# Patient Record
Sex: Male | Born: 1970 | Race: White | Hispanic: No | Marital: Single | Smoking: Former smoker
Health system: Southern US, Community
[De-identification: ages and names within clinical notes are randomized; demographics above are authoritative.]

## PROBLEM LIST (undated history)

## (undated) DIAGNOSIS — R6521 Severe sepsis with septic shock: Secondary | ICD-10-CM

## (undated) DIAGNOSIS — E0590 Thyrotoxicosis, unspecified without thyrotoxic crisis or storm: Secondary | ICD-10-CM

## (undated) DIAGNOSIS — G4733 Obstructive sleep apnea (adult) (pediatric): Secondary | ICD-10-CM

## (undated) DIAGNOSIS — I10 Essential (primary) hypertension: Secondary | ICD-10-CM

## (undated) DIAGNOSIS — A419 Sepsis, unspecified organism: Secondary | ICD-10-CM

## (undated) DIAGNOSIS — C250 Malignant neoplasm of head of pancreas: Secondary | ICD-10-CM

## (undated) HISTORY — PX: WHIPPLE PROCEDURE: SHX2667

## (undated) HISTORY — PX: BILIARY DRAINAGE: SHX1229

---

## 2006-09-03 ENCOUNTER — Ambulatory Visit: Payer: Self-pay | Admitting: Emergency Medicine

## 2006-09-04 ENCOUNTER — Ambulatory Visit: Payer: Self-pay | Admitting: Emergency Medicine

## 2006-10-30 ENCOUNTER — Ambulatory Visit: Payer: Self-pay | Admitting: Family Medicine

## 2006-11-10 ENCOUNTER — Ambulatory Visit: Payer: Self-pay | Admitting: Family Medicine

## 2008-04-26 ENCOUNTER — Encounter: Admission: RE | Admit: 2008-04-26 | Discharge: 2008-04-26 | Payer: Self-pay | Admitting: Specialist

## 2010-02-09 ENCOUNTER — Ambulatory Visit: Payer: Self-pay | Admitting: Diagnostic Radiology

## 2010-02-09 ENCOUNTER — Emergency Department (HOSPITAL_BASED_OUTPATIENT_CLINIC_OR_DEPARTMENT_OTHER): Admission: EM | Admit: 2010-02-09 | Discharge: 2010-02-09 | Payer: Self-pay | Admitting: Emergency Medicine

## 2010-05-30 LAB — BASIC METABOLIC PANEL
BUN: 14 mg/dL (ref 6–23)
Calcium: 9.1 mg/dL (ref 8.4–10.5)
GFR calc non Af Amer: >60 mL/min (ref >60)
Glucose, Bld: 133 mg/dL — ABNORMAL HIGH (ref 70–99)
Potassium: 3.9 mEq/L (ref 3.5–5.1)
Sodium: 143 mEq/L (ref 135–145)

## 2010-05-30 LAB — POCT CARDIAC MARKERS
CKMB, poc: <1 ng/mL — ABNORMAL LOW (ref 1.0–8.0)
Myoglobin, poc: 54.7 ng/mL (ref 12–200)

## 2010-05-30 LAB — DIFFERENTIAL
Basophils Absolute: 0 10*3/uL (ref 0.0–0.1)
Basophils Relative: 0 % (ref 0–1)
Lymphocytes Relative: 20 % (ref 12–46)
Neutro Abs: 5.9 10*3/uL (ref 1.7–7.7)
Neutrophils Relative %: 70 % (ref 43–77)

## 2010-05-30 LAB — CBC
HCT: 47.8 % (ref 39.0–52.0)
Hemoglobin: 16.5 g/dL (ref 13.0–17.0)
MCHC: 34.6 g/dL (ref 30.0–36.0)
RDW: 14.1 % (ref 11.5–15.5)
WBC: 8.4 10*3/uL (ref 4.0–10.5)

## 2011-05-09 DIAGNOSIS — I10 Essential (primary) hypertension: Secondary | ICD-10-CM

## 2011-05-09 DIAGNOSIS — G4733 Obstructive sleep apnea (adult) (pediatric): Secondary | ICD-10-CM | POA: Insufficient documentation

## 2011-05-09 HISTORY — DX: Essential (primary) hypertension: I10

## 2011-05-09 HISTORY — DX: Obstructive sleep apnea (adult) (pediatric): G47.33

## 2014-09-28 DIAGNOSIS — K831 Obstruction of bile duct: Secondary | ICD-10-CM | POA: Insufficient documentation

## 2014-10-19 DIAGNOSIS — C250 Malignant neoplasm of head of pancreas: Secondary | ICD-10-CM | POA: Insufficient documentation

## 2014-10-19 HISTORY — DX: Malignant neoplasm of head of pancreas: C25.0

## 2015-04-01 DIAGNOSIS — A0472 Enterocolitis due to Clostridium difficile, not specified as recurrent: Secondary | ICD-10-CM | POA: Insufficient documentation

## 2015-09-06 DIAGNOSIS — M75122 Complete rotator cuff tear or rupture of left shoulder, not specified as traumatic: Secondary | ICD-10-CM | POA: Insufficient documentation

## 2015-10-06 DIAGNOSIS — I2699 Other pulmonary embolism without acute cor pulmonale: Secondary | ICD-10-CM | POA: Insufficient documentation

## 2016-01-17 DIAGNOSIS — Z7901 Long term (current) use of anticoagulants: Secondary | ICD-10-CM | POA: Insufficient documentation

## 2016-02-16 DIAGNOSIS — E0590 Thyrotoxicosis, unspecified without thyrotoxic crisis or storm: Secondary | ICD-10-CM | POA: Insufficient documentation

## 2016-02-16 HISTORY — DX: Thyrotoxicosis, unspecified without thyrotoxic crisis or storm: E05.90

## 2017-02-04 ENCOUNTER — Encounter: Payer: Self-pay | Admitting: Dietician

## 2017-02-04 ENCOUNTER — Encounter: Payer: BLUE CROSS/BLUE SHIELD | Attending: Hematology and Oncology | Admitting: Dietician

## 2017-02-04 VITALS — Ht 75.0 in | Wt 229.7 lb

## 2017-02-04 DIAGNOSIS — F909 Attention-deficit hyperactivity disorder, unspecified type: Secondary | ICD-10-CM | POA: Insufficient documentation

## 2017-02-04 DIAGNOSIS — C250 Malignant neoplasm of head of pancreas: Secondary | ICD-10-CM

## 2017-02-04 NOTE — Patient Instructions (Signed)
   Keep fiber intake low for at least 12 weeks to allow GI system to heal completely-- no whole grains, raw veggies or fruits, nuts, popcorn, etc.   Avoid caffeine as much as possible.   Try taking Align again for several weeks.   Choose low fat and low sugar foods for easier digestion.   Consider adding supplements such as Premier protein, Carnation breakfast drink (low sugar), boost low sugar.

## 2017-02-04 NOTE — Progress Notes (Signed)
Medical Nutrition Therapy: Visit start time: 1100  end time: 1215  Assessment:  Diagnosis: malignant neoplasm of head of pancreas Past medical history: none significant per patient Psychosocial issues/ stress concerns: none at this time Preferred learning method:  . Auditory . Hands-on  Current weight: 229.7lbs with steel-toed shoes Height: 6'3" Medications, supplements: reconciled list in medical record  Progress and evaluation: Patient reports weight loss of 5lbs over the weekend due to episodes of diarrhea and poor appetite. He reports having whipple procedure done 2 years ago to remove cancer, and has had frequent and often watery bowel movements since then, sometimes soon after eating. He is taking pancreatic enzymes with partial benefit. He complains of very low energy; he also reports waking up several times each night. He has experienced significant personal stress before and since cancer diagnosis with loss of grandmother, sudden death of his wife, loss of 2 pets.    Physical activity: weight lifting 1hr 4-5 times a week. No cardio recently due to lack of energy, concern of low BG reaction.  Dietary Intake:  Usual eating pattern includes 3 meals and 2 snacks per day. Dining out frequency: 6-7 meals per week.  Breakfast: protein cereal, or whole grain, frosted mini wheats lowfat lactaid milk; sometimes biscuit; OJ or coffee Snack: 9-9:30am  Today 9-grain bagel [place] and 2 sw cheese, minestrone soup; yogurt or fruit instead of soup Lunch: varies: veg coup, chili, sandwich, sub whole grain bread. Leftovers Snack: fruit, peanut butter 4-5tsp with apple or banana Supper: varies- burger, lasagna, chicken sandwich Snack: sometimes cookies,whole wheat crackers with peanut butter or cheese; chicken noodle soup (better appetite after exercise) Beverages: water, fruit juice, 1-2 sodas daily coke, pepsi, or monster energy drink.  Nutrition Care Education: Topics covered: weight loss  prevention, diarrhea due to whipple procedure for pancreatic cancer Basic nutrition: basic food groups, appropriate nutrient balance, appropriate meal and snack schedule, general nutrition guidelines    Weight control: sources of calories for preventing weight loss as well as preventing diarrhea-- protein, small amounts of fats, option of MCT oil if needed. Pancreatic cancer/ whipple procedure: importance of increasing healthy GI bacteria with probiotic foods and/or supplements; discussed foods that increase diarrhea such as sugar, high fat foods, spicy foods, caffeine; advised low fiber diet until symptoms resolve; discussed option of nutritional supplements to increase calories if weight loss continues.   Nutritional Diagnosis:  [place]-1.4 Altered GI function As related to history of pancreatic cancer and whipple procedure.  As evidenced by chronic diarrhea. [place]-3.2 Unintentional weight loss As related to chronic diarrhea, poor appetite at times.  As evidenced by patient report.  Intervention: Instruction as noted above.   Patient has tried some measures to control diarrhea without success. Advised allowing time for his system to heal.    Mother provides support for patient, prepares some meals for him.   Set goals with input from patient.    No follow-up scheduled at this time, patient to schedule later if needed.  Education Materials given:  . Whipple procedure nutrition therapy . Diarrhea nutrition therapy  . Goals/ instructions  Learner/ who was taught:  . Patient  . Family member: mother  Level of understanding: Marland Kitchen Verbalizes/ demonstrates competency  Demonstrated degree of understanding via:   Teach back Learning barriers: . None  Willingness to learn/ readiness for change: . Eager, change in progress  Monitoring and Evaluation:  Dietary intake, GI symptoms, and body weight      follow up: prn

## 2017-03-20 ENCOUNTER — Inpatient Hospital Stay
Admission: EM | Admit: 2017-03-20 | Discharge: 2017-03-22 | DRG: 871 | Disposition: A | Discharge status: Other Acute Inpatient Hospital | Payer: BLUE CROSS/BLUE SHIELD | Attending: Internal Medicine | Admitting: Internal Medicine

## 2017-03-20 ENCOUNTER — Emergency Department: Payer: BLUE CROSS/BLUE SHIELD

## 2017-03-20 ENCOUNTER — Encounter: Payer: Self-pay | Admitting: Emergency Medicine

## 2017-03-20 ENCOUNTER — Other Ambulatory Visit: Payer: Self-pay

## 2017-03-20 DIAGNOSIS — I10 Essential (primary) hypertension: Secondary | ICD-10-CM | POA: Diagnosis present

## 2017-03-20 DIAGNOSIS — R509 Fever, unspecified: Secondary | ICD-10-CM

## 2017-03-20 DIAGNOSIS — E872 Acidosis: Secondary | ICD-10-CM | POA: Diagnosis present

## 2017-03-20 DIAGNOSIS — B961 Klebsiella pneumoniae [K. pneumoniae] as the cause of diseases classified elsewhere: Secondary | ICD-10-CM | POA: Diagnosis present

## 2017-03-20 DIAGNOSIS — R000 Tachycardia, unspecified: Secondary | ICD-10-CM | POA: Diagnosis present

## 2017-03-20 DIAGNOSIS — N179 Acute kidney failure, unspecified: Secondary | ICD-10-CM | POA: Diagnosis present

## 2017-03-20 DIAGNOSIS — R740 Nonspecific elevation of levels of transaminase and lactic acid dehydrogenase [LDH]: Secondary | ICD-10-CM | POA: Diagnosis present

## 2017-03-20 DIAGNOSIS — J150 Pneumonia due to Klebsiella pneumoniae: Secondary | ICD-10-CM | POA: Diagnosis present

## 2017-03-20 DIAGNOSIS — R4182 Altered mental status, unspecified: Secondary | ICD-10-CM

## 2017-03-20 DIAGNOSIS — C259 Malignant neoplasm of pancreas, unspecified: Secondary | ICD-10-CM | POA: Diagnosis present

## 2017-03-20 DIAGNOSIS — F909 Attention-deficit hyperactivity disorder, unspecified type: Secondary | ICD-10-CM | POA: Diagnosis present

## 2017-03-20 DIAGNOSIS — K8681 Exocrine pancreatic insufficiency: Secondary | ICD-10-CM | POA: Diagnosis present

## 2017-03-20 DIAGNOSIS — R7881 Bacteremia: Secondary | ICD-10-CM | POA: Diagnosis not present

## 2017-03-20 DIAGNOSIS — A419 Sepsis, unspecified organism: Secondary | ICD-10-CM | POA: Diagnosis present

## 2017-03-20 DIAGNOSIS — Z79899 Other long term (current) drug therapy: Secondary | ICD-10-CM | POA: Diagnosis not present

## 2017-03-20 DIAGNOSIS — Z91013 Allergy to seafood: Secondary | ICD-10-CM

## 2017-03-20 DIAGNOSIS — E0590 Thyrotoxicosis, unspecified without thyrotoxic crisis or storm: Secondary | ICD-10-CM | POA: Diagnosis present

## 2017-03-20 DIAGNOSIS — R6521 Severe sepsis with septic shock: Secondary | ICD-10-CM | POA: Diagnosis present

## 2017-03-20 DIAGNOSIS — J9600 Acute respiratory failure, unspecified whether with hypoxia or hypercapnia: Secondary | ICD-10-CM

## 2017-03-20 DIAGNOSIS — Z90411 Acquired partial absence of pancreas: Secondary | ICD-10-CM

## 2017-03-20 DIAGNOSIS — R160 Hepatomegaly, not elsewhere classified: Secondary | ICD-10-CM | POA: Diagnosis present

## 2017-03-20 DIAGNOSIS — Z86711 Personal history of pulmonary embolism: Secondary | ICD-10-CM

## 2017-03-20 DIAGNOSIS — E860 Dehydration: Secondary | ICD-10-CM | POA: Diagnosis present

## 2017-03-20 DIAGNOSIS — E871 Hypo-osmolality and hyponatremia: Secondary | ICD-10-CM | POA: Diagnosis present

## 2017-03-20 DIAGNOSIS — G4733 Obstructive sleep apnea (adult) (pediatric): Secondary | ICD-10-CM | POA: Diagnosis present

## 2017-03-20 DIAGNOSIS — Z91041 Radiographic dye allergy status: Secondary | ICD-10-CM | POA: Diagnosis not present

## 2017-03-20 DIAGNOSIS — C799 Secondary malignant neoplasm of unspecified site: Secondary | ICD-10-CM | POA: Diagnosis present

## 2017-03-20 DIAGNOSIS — R1900 Intra-abdominal and pelvic swelling, mass and lump, unspecified site: Secondary | ICD-10-CM | POA: Diagnosis present

## 2017-03-20 DIAGNOSIS — E861 Hypovolemia: Secondary | ICD-10-CM | POA: Diagnosis present

## 2017-03-20 HISTORY — DX: Sepsis, unspecified organism: A41.9

## 2017-03-20 HISTORY — DX: Severe sepsis with septic shock: R65.21

## 2017-03-20 HISTORY — DX: Essential (primary) hypertension: I10

## 2017-03-20 HISTORY — DX: Thyrotoxicosis, unspecified without thyrotoxic crisis or storm: E05.90

## 2017-03-20 HISTORY — DX: Obstructive sleep apnea (adult) (pediatric): G47.33

## 2017-03-20 HISTORY — DX: Malignant neoplasm of head of pancreas: C25.0

## 2017-03-20 LAB — CSF CELL COUNT WITH DIFFERENTIAL
Eosinophils, CSF: 0 %
Eosinophils, CSF: 0 %
Lymphs, CSF: 0 %
Lymphs, CSF: 100 %
Monocyte-Macrophage-Spinal Fluid: 0 %
Monocyte-Macrophage-Spinal Fluid: 0 %
OTHER CELLS CSF: 0
OTHER CELLS CSF: 0
RBC COUNT CSF: 5 /mm3 — AB (ref 0–3)
RBC Count, CSF: 0 /mm3 (ref 0–3)
Segmented Neutrophils-CSF: 0 %
Segmented Neutrophils-CSF: 0 %
TUBE #: 1
TUBE #: 3
WBC, CSF: 0 /mm3 (ref 0–5)
WBC, CSF: 2 /mm3 (ref 0–5)

## 2017-03-20 LAB — URINALYSIS, ROUTINE W REFLEX MICROSCOPIC
BACTERIA UA: NONE SEEN
BILIRUBIN URINE: NEGATIVE
Glucose, UA: NEGATIVE mg/dL
Ketones, ur: NEGATIVE mg/dL
LEUKOCYTES UA: NEGATIVE
Nitrite: NEGATIVE
Protein, ur: NEGATIVE mg/dL
SPECIFIC GRAVITY, URINE: 1.023 (ref 1.005–1.030)
pH: 5 (ref 5.0–8.0)

## 2017-03-20 LAB — COMPREHENSIVE METABOLIC PANEL
ALBUMIN: 3.2 g/dL — AB (ref 3.5–5.0)
ALT: 104 U/L — ABNORMAL HIGH (ref 17–63)
ANION GAP: 14 (ref 5–15)
AST: 93 U/L — AB (ref 15–41)
Alkaline Phosphatase: 160 U/L — ABNORMAL HIGH (ref 38–126)
BUN: 19 mg/dL (ref 6–20)
CO2: 20 mmol/L — ABNORMAL LOW (ref 22–32)
Calcium: 8.3 mg/dL — ABNORMAL LOW (ref 8.9–10.3)
Chloride: 95 mmol/L — ABNORMAL LOW (ref 101–111)
Creatinine, Ser: 1.37 mg/dL — ABNORMAL HIGH (ref 0.61–1.24)
GFR calc Af Amer: >60 mL/min (ref >60)
GFR calc non Af Amer: >60 mL/min (ref >60)
GLUCOSE: 113 mg/dL — AB (ref 65–99)
POTASSIUM: 4.3 mmol/L (ref 3.5–5.1)
SODIUM: 129 mmol/L — AB (ref 135–145)
Total Bilirubin: 1.4 mg/dL — ABNORMAL HIGH (ref 0.3–1.2)
Total Protein: 7.3 g/dL (ref 6.5–8.1)

## 2017-03-20 LAB — PROCALCITONIN: Procalcitonin: 2.85 ng/mL

## 2017-03-20 LAB — INFLUENZA PANEL BY PCR (TYPE A & B)
Influenza A By PCR: NEGATIVE
Influenza B By PCR: NEGATIVE

## 2017-03-20 LAB — URINE DRUG SCREEN, QUALITATIVE (ARMC ONLY)
Amphetamines, Ur Screen: NOT DETECTED
BARBITURATES, UR SCREEN: NOT DETECTED
BENZODIAZEPINE, UR SCRN: NOT DETECTED
COCAINE METABOLITE, UR ~~LOC~~: NOT DETECTED
Cannabinoid 50 Ng, Ur ~~LOC~~: NOT DETECTED
MDMA (Ecstasy)Ur Screen: NOT DETECTED
Methadone Scn, Ur: NOT DETECTED
OPIATE, UR SCREEN: POSITIVE — AB
PHENCYCLIDINE (PCP) UR S: NOT DETECTED
Tricyclic, Ur Screen: NOT DETECTED

## 2017-03-20 LAB — CBC WITH DIFFERENTIAL/PLATELET
Basophils Absolute: 0 10*3/uL (ref 0–0.1)
Basophils Relative: 0 %
Eosinophils Absolute: 0 10*3/uL (ref 0–0.7)
Eosinophils Relative: 0 %
HEMATOCRIT: 42.7 % (ref 40.0–52.0)
Hemoglobin: 14.4 g/dL (ref 13.0–18.0)
LYMPHS ABS: 0.5 10*3/uL — AB (ref 1.0–3.6)
LYMPHS PCT: 10 %
MCH: 28.7 pg (ref 26.0–34.0)
MCHC: 33.8 g/dL (ref 32.0–36.0)
MCV: 85 fL (ref 80.0–100.0)
MONO ABS: 0.1 10*3/uL — AB (ref 0.2–1.0)
Monocytes Relative: 2 %
NEUTROS ABS: 4 10*3/uL (ref 1.4–6.5)
Neutrophils Relative %: 88 %
Platelets: 192 10*3/uL (ref 150–440)
RBC: 5.02 MIL/uL (ref 4.40–5.90)
RDW: 14.8 % — AB (ref 11.5–14.5)
WBC: 4.6 10*3/uL (ref 3.8–10.6)

## 2017-03-20 LAB — LACTIC ACID, PLASMA
LACTIC ACID, VENOUS: 3.5 mmol/L — AB (ref 0.5–1.9)
LACTIC ACID, VENOUS: 2.6 mmol/L — AB (ref 0.5–1.9)

## 2017-03-20 LAB — MRSA PCR SCREENING: MRSA BY PCR: NEGATIVE

## 2017-03-20 LAB — GLUCOSE, CAPILLARY
GLUCOSE-CAPILLARY: 119 mg/dL — AB (ref 65–99)
Glucose-Capillary: 79 mg/dL (ref 65–99)

## 2017-03-20 LAB — TROPONIN I: Troponin I: <0.03 ng/mL (ref <0.03)

## 2017-03-20 LAB — PROTEIN AND GLUCOSE, CSF
GLUCOSE CSF: 62 mg/dL (ref 40–70)
Total  Protein, CSF: 22 mg/dL (ref 15–45)

## 2017-03-20 MED ORDER — KETOROLAC TROMETHAMINE 30 MG/ML IJ SOLN
30.0000 mg | Freq: Once | INTRAMUSCULAR | Status: AC
Start: 2017-03-20 — End: 2017-03-20
  Administered 2017-03-20: 30 mg via INTRAVENOUS
  Filled 2017-03-20: qty 1

## 2017-03-20 MED ORDER — SODIUM CHLORIDE 0.9 % IV SOLN: INTRAVENOUS | Status: DC

## 2017-03-20 MED ORDER — NALOXONE HCL 2 MG/2ML IJ SOSY
0.4000 mg | PREFILLED_SYRINGE | Freq: Once | INTRAMUSCULAR | Status: AC
  Administered 2017-03-20: 0.4 mg via INTRAVENOUS

## 2017-03-20 MED ORDER — PIPERACILLIN-TAZOBACTAM 3.375 G IVPB 30 MIN
3.3750 g | Freq: Once | INTRAVENOUS | Status: AC
  Administered 2017-03-20: 3.375 g via INTRAVENOUS
  Filled 2017-03-20: qty 50

## 2017-03-20 MED ORDER — CEFEPIME HCL 2 G IJ SOLR
2.0000 g | Freq: Three times a day (TID) | INTRAMUSCULAR | Status: DC
  Administered 2017-03-20: 2 g via INTRAVENOUS
  Filled 2017-03-20 (×3): qty 2

## 2017-03-20 MED ORDER — PIPERACILLIN-TAZOBACTAM 3.375 G IVPB: 3.3750 g | Freq: Three times a day (TID) | INTRAVENOUS | Status: DC

## 2017-03-20 MED ORDER — NOREPINEPHRINE BITARTRATE 1 MG/ML IV SOLN
0.0000 ug/min | Freq: Once | INTRAVENOUS | Status: AC
  Administered 2017-03-20: 10 ug/min via INTRAVENOUS
  Filled 2017-03-20: qty 4

## 2017-03-20 MED ORDER — PANCRELIPASE (LIP-PROT-AMYL) 12000-38000 UNITS PO CPEP: 36000.0000 [IU] | ORAL_CAPSULE | ORAL | Status: DC | PRN

## 2017-03-20 MED ORDER — LIDOCAINE HCL (PF) 1 % IJ SOLN
10.0000 mL | Freq: Once | INTRAMUSCULAR | Status: AC
  Administered 2017-03-20: 10 mL

## 2017-03-20 MED ORDER — VANCOMYCIN HCL 10 G IV SOLR
1500.0000 mg | Freq: Two times a day (BID) | INTRAVENOUS | Status: DC
  Administered 2017-03-20 – 2017-03-21 (×2): 1500 mg via INTRAVENOUS
  Filled 2017-03-20 (×3): qty 1500

## 2017-03-20 MED ORDER — SODIUM CHLORIDE 0.9 % IV BOLUS (SEPSIS)
500.0000 mL | Freq: Once | INTRAVENOUS | Status: AC
  Administered 2017-03-20: 500 mL via INTRAVENOUS

## 2017-03-20 MED ORDER — ENOXAPARIN SODIUM 40 MG/0.4ML ~~LOC~~ SOLN
40.0000 mg | SUBCUTANEOUS | Status: DC
  Administered 2017-03-20 – 2017-03-21 (×2): 40 mg via SUBCUTANEOUS
  Filled 2017-03-20 (×2): qty 0.4

## 2017-03-20 MED ORDER — PANCRELIPASE (LIP-PROT-AMYL) 12000-38000 UNITS PO CPEP
36000.0000 [IU] | ORAL_CAPSULE | Freq: Three times a day (TID) | ORAL | Status: DC
  Administered 2017-03-21 – 2017-03-22 (×2): 36000 [IU] via ORAL
  Filled 2017-03-20 (×3): qty 3

## 2017-03-20 MED ORDER — NALOXONE HCL 2 MG/2ML IJ SOSY
PREFILLED_SYRINGE | INTRAMUSCULAR | Status: AC
Start: 2017-03-20 — End: 2017-03-20
  Administered 2017-03-20: 0.4 mg via INTRAVENOUS
  Filled 2017-03-20: qty 2

## 2017-03-20 MED ORDER — NOREPINEPHRINE BITARTRATE 1 MG/ML IV SOLN
0.0000 ug/min | INTRAVENOUS | Status: DC
  Administered 2017-03-21: 12 ug/min via INTRAVENOUS
  Filled 2017-03-20 (×2): qty 4

## 2017-03-20 MED ORDER — LORAZEPAM 2 MG/ML IJ SOLN
0.5000 mg | Freq: Once | INTRAMUSCULAR | Status: AC
  Administered 2017-03-20: 0.5 mg via INTRAVENOUS

## 2017-03-20 MED ORDER — LACTATED RINGERS IV SOLN
INTRAVENOUS | Status: DC
  Administered 2017-03-20 – 2017-03-22 (×6): via INTRAVENOUS

## 2017-03-20 MED ORDER — LIDOCAINE HCL (PF) 1 % IJ SOLN
INTRAMUSCULAR | Status: AC
  Administered 2017-03-20: 10 mL
  Filled 2017-03-20: qty 10

## 2017-03-20 MED ORDER — SODIUM CHLORIDE 0.9 % IV BOLUS (SEPSIS)
1000.0000 mL | Freq: Once | INTRAVENOUS | Status: AC
  Administered 2017-03-20: 1000 mL via INTRAVENOUS

## 2017-03-20 MED ORDER — ONDANSETRON HCL 4 MG/2ML IJ SOLN
4.0000 mg | Freq: Four times a day (QID) | INTRAMUSCULAR | Status: DC | PRN
  Administered 2017-03-22: 4 mg via INTRAVENOUS
  Filled 2017-03-20: qty 2

## 2017-03-20 MED ORDER — VANCOMYCIN HCL IN DEXTROSE 1-5 GM/200ML-% IV SOLN
1000.0000 mg | Freq: Once | INTRAVENOUS | Status: AC
  Administered 2017-03-20: 1000 mg via INTRAVENOUS
  Filled 2017-03-20: qty 200

## 2017-03-20 MED ORDER — ACETAMINOPHEN 325 MG PO TABS
650.0000 mg | ORAL_TABLET | Freq: Four times a day (QID) | ORAL | Status: DC | PRN
  Administered 2017-03-21 – 2017-03-22 (×2): 650 mg via ORAL
  Filled 2017-03-20 (×2): qty 2

## 2017-03-20 MED ORDER — LORAZEPAM 2 MG/ML IJ SOLN
INTRAMUSCULAR | Status: AC
  Administered 2017-03-20: 0.5 mg via INTRAVENOUS
  Filled 2017-03-20: qty 1

## 2017-03-20 MED ORDER — ACETAMINOPHEN 650 MG RE SUPP: 650.0000 mg | Freq: Four times a day (QID) | RECTAL | Status: DC | PRN

## 2017-03-20 MED ORDER — ORAL CARE MOUTH RINSE
15.0000 mL | Freq: Two times a day (BID) | OROMUCOSAL | Status: DC
  Administered 2017-03-21 – 2017-03-22 (×3): 15 mL via OROMUCOSAL

## 2017-03-20 MED ORDER — ONDANSETRON HCL 4 MG PO TABS: 4.0000 mg | ORAL_TABLET | Freq: Four times a day (QID) | ORAL | Status: DC | PRN

## 2017-03-20 NOTE — ED Provider Notes (Signed)
[facility] Emergency Department Provider Note  Time seen: 4:37 PM  I have reviewed the triage vital signs and the nursing notes.   HISTORY  Chief Complaint Altered Mental Status    HPI William Lara is a 47 y.o. male with a past medical history of ADHD, pulmonary embolism, pancreatic cancer status post Whipple procedure, hypertension, PE on anticoagulation, presents to the emergency department for decreased responsiveness.  According to report mostly for mom for the past 2 days the patient has been very weak and feverish.  Today the patient went to [facility] clinic for evaluation and was sent to the emergency department given largely unresponsiveness.  Patient arrives in a wheelchair, profusely diaphoretic, febrile to 103 per [place] [facility].  Patient minimally responsive upon arrival will moan to sternal rub, we will not follow commands or answer questions.  No further history is provided for mom as far as cough, congestion, abdominal pain, etc.   History reviewed. No pertinent past medical history.  Patient Active Problem List   Diagnosis Date Noted  . ADHD (attention deficit hyperactivity disorder) 02/04/2017  . Hyperthyroidism without crisis 02/16/2016  . Chronic anticoagulation 01/17/2016  . Pulmonary embolism ([place]) 10/06/2015  . Complete tear of left rotator cuff 09/06/2015  . C. difficile colitis 04/01/2015  . Malignant tumor of head of pancreas ([place]) 10/19/2014  . Obstructive jaundice 09/28/2014  . Essential hypertension 05/09/2011  . Obstructive sleep apnea 05/09/2011    History reviewed. No pertinent surgical history.  Prior to Admission medications   Medication Sig Start Date End Date Taking? Authorizing Provider  ARGININE PO Take by mouth.    [provider]  lipase/protease/amylase (CREON) 36000 UNITS CPEP capsule Take 6 capsules with meals and 3 caps with snacks 12/19/16   [provider]  Multiple Vitamins-Minerals  (MULTIVITAMIN ADULT PO) Take by mouth.    [provider]  Omega-3 Fatty Acids (FISH OIL PO) Take by mouth.    [provider]    Allergies  Allergen Reactions  . Iodinated Diagnostic Agents Other (See Comments) and Shortness Of Breath    Patient had a breakthrough reaction to IV CT contrast despite a 13 hour prep.  The patient should not have iodinated CT contrast ever again due to a breakthrough reaction.  . Shellfish Allergy Other (See Comments)    Shellfish-Migraine. Does fine with CT scans.    History reviewed. No pertinent family history.  Social History Social History   Tobacco Use  . Smoking status: Never Smoker  . Smokeless tobacco: Never Used  Substance Use Topics  . Alcohol use: Yes    Alcohol/week: 0.0 - 0.6 oz    Comment: occasional  . Drug use: No    Review of Systems Unable to obtain a review of systems as the patient is altered with minimal responsiveness.  ____________________________________________   PHYSICAL EXAM:  VITAL SIGNS: ED Triage Vitals [03/20/17 1603]  Enc Vitals Group     BP 102/61     Pulse Rate (!) 180     Resp (!) 28     Temp      Temp src      SpO2 94 %     Weight 230 lb (104.3 kg)     Height 6\' 3"  (1.905 m)     Head Circumference      Peak Flow      Pain Score      Pain Loc      Pain Edu?  Excl. in [place]?    Constitutional: Patient is very somnolent, will occasionally open eyes, moaning to painful stimuli.  Diffusely diaphoretic. Eyes: Normal exam ENT   Head: Normocephalic and atraumatic.   Mouth/Throat: Mucous membranes are moist. Cardiovascular: Regular rhythm, rate around 150 bpm.  No obvious murmur. Respiratory: Patient has clear breath sounds bilaterally Gastrointestinal: Soft, no distention, no obvious tenderness to palpation. Musculoskeletal: Patient will occasionally move all extremities.   Neurologic: Patient moaning to painful stimuli only, not answering questions or following commands.   Will open eyes spontaneously at times.  Moves all extremities at times. Skin: Skin is warm/hot to the touch and extremely diaphoretic Psychiatric: Abnormal behavior, altered/minimally responsive.  ____________________________________________    EKG    ____________________________________________    RADIOLOGY  Chest x-ray does not show any acute finding CT scan of the head is negative  ____________________________________________   INITIAL IMPRESSION / ASSESSMENT AND PLAN / ED COURSE  Pertinent labs & imaging results that were available during my care of the patient were reviewed by me and considered in my medical decision making (see chart for details).  Patient presents to the emergency department minimally responsive hot and diffusely diaphoretic.  Differential would include sepsis, influenza, pneumonia, abdominal infection, seizure/CVA/ICH.  I have activated sepsis protocols.  We will some blood work, urine, check for influenza.  Begin 2 L IV bolus of normal saline.  Currently patient does appear to be adequately protecting his airway, we will continue to closely monitor the situation to see if the patient requires intubation.  We will dose Toradol.  Some report of past pain medication usage which was prescribed to him we will dose Narcan and continue to closely monitor.  Patient currently satting 88% on room air we will placed on 2 L nasal cannula.  Patient receiving IV broad-spectrum antibiotics.  Satting 94-95% on 2 L currently.  Patient has received IV fluids, Narcan with some response after the Narcan.  Patient is more awake, states he feels confused is able to talk, but still unreliable historian.  Definitely improved from arrival we will continue to closely monitor the patient.  Chest x-ray and CT showed no acute abnormality.  Highly suspect influenza, influenza test pending.  Labs have resulted with a negative flu test, lactate of 3.5.  Sodium 129.  Urinalysis appears  normal.  White blood cell count around 4.  The patient's altered mental status with significant fever of 103 on arrival, I have performed a lumbar puncture we will send CSF for analysis.  CSF appeared clear.  Patient being covered with broad-spectrum antibiotics will be admitted to the hospital for further treatment.  LUMBAR PUNCTURE  Date/Time: 03/20/2017 at 7:28 PM Performed by: Harvest Dark  Consent: Verbal consent obtained. Written consent obtained. Risks and benefits: risks, benefits and alternatives were discussed Consent given by: Patient and mother Patient understanding: patient states understanding of the procedure being performed  Patient consent: the patient's understanding of the procedure matches consent given  Procedure consent: procedure consent matches procedure scheduled  Relevant documents: relevant documents present and verified  Test results: test results available and properly labeled Site marked: the operative site was marked Imaging studies: imaging studies available  Required items: required blood products, implants, devices, and special equipment available  Patient identity confirmed: verbally with patient and arm band  Time out: Immediately prior to procedure a "time out" was called to verify the correct patient, procedure, equipment, support staff and site/side marked as required.  Indications: fever, altered, rule out  meningitis Anesthesia: local infiltration Local anesthetic: lidocaine 1% without epinephrine Anesthetic total: 5 ml Patient sedated: no Analgesia: none Preparation: Patient was prepped and draped in the usual sterile fashion. Lumbar space: L3-L4 interspace Patient's position: left lateral decubitus Needle gauge: 22 Needle length: 3.5 in Number of attempts: 1 Opening pressure: 20.5 cm H2O Fluid appearance: clear Tubes of fluid: 4 Total volume: 8 ml Post-procedure: site cleaned and adhesive bandage applied Patient tolerance: Patient  tolerated the procedure well with no immediate complications     CRITICAL CARE Performed by: Harvest Dark   Total critical care time: 60 minutes  Critical care time was exclusive of separately billable procedures and treating other patients.  Critical care was necessary to treat or prevent imminent or life-threatening deterioration.  Critical care was time spent personally by me on the following activities: development of treatment plan with patient and/or surrogate as well as nursing, discussions with consultants, evaluation of patient's response to treatment, examination of patient, obtaining history from patient or surrogate, ordering and performing treatments and interventions, ordering and review of laboratory studies, ordering and review of radiographic studies, pulse oximetry and re-evaluation of patient's condition.   ____________________________________________   FINAL CLINICAL IMPRESSION(S) / ED DIAGNOSES  Fever Sepsis    Harvest Dark, MD 03/20/17 1929

## 2017-03-20 NOTE — Progress Notes (Signed)
Pharmacy Antibiotic Note  William Lara is a 47 y.o. male admitted on 03/20/2017 with sepsis.  Pharmacy has been consulted for cefepime dosing.  Plan: Will start patient on cefepime 2g IV q8h   Height: 6\' 4"  (193 cm) Weight: 216 lb 7.9 oz (98.2 kg) IBW/kg (Calculated) : 86.8  Temp (24hrs), Avg:101 F (38.3 C), Min:99 F (37.2 C), Max:103 F (39.4 C)  Recent Labs  Lab 03/20/17 1550 03/20/17 1552 03/20/17 1749  WBC 4.6  --   --   CREATININE 1.37*  --   --   LATICACIDVEN  --  3.5* 2.6*    Estimated Creatinine Clearance: 83.6 mL/min (A) (by C-G formula based on SCr of 1.37 mg/dL (H)).    Allergies  Allergen Reactions  . Iodinated Diagnostic Agents Anaphylaxis, Shortness Of Breath and Other (See Comments)    Patient had a breakthrough reaction to IV CT contrast despite a 13 hour prep.  The patient should not have iodinated CT contrast ever again due to a breakthrough reaction.  . Shellfish Allergy Other (See Comments)    Shellfish-Migraine. Does fine with CT scans.    Thank you for allowing pharmacy to be a part of this patient's care.  Tobie Lords, PharmD, BCPS Clinical Pharmacist 03/20/2017

## 2017-03-20 NOTE — ED Triage Notes (Signed)
Pt presents to ED, transferred via w/c from [facility] for AMS. T103.2 @ [facility], HR 180s, O2 sat 90% RA. Pt is confused, lethargic, requires max assist to transfer from wheelchair to bed. Incontinence of urine noted. Pt's mother at bedside states pt has "been sick" for 2-3 days with fever and chills. EDP at bedside.

## 2017-03-20 NOTE — H&P (Addendum)
[facility] [place] NAME: William Lara    MR#:  [id number]  DATE OF BIRTH:  [birth year]  DATE OF ADMISSION:  03/20/2017  PRIMARY CARE PHYSICIAN: Stoney Bang, MD   REQUESTING/REFERRING PHYSICIAN: Dr. Kerman Passey.  CHIEF COMPLAINT: Altered mental status   Chief Complaint  Patient presents with  . Altered Mental Status    HISTORY OF PRESENT ILLNESS:  William Lara  is a 47 y.o. male with a known history of history of previous pancreatic cancer status post Whipple procedure 2 years ago comes in brought by family because of altered mental status.  Patient has been feeling sick for the past 3 days associated with diffuse body aches, weakness.  Mom took him to PCP, temperature 103.2 and he was sent to ER because of lethargy, high fever.  By the time he came to emergency room he is completely unresponsive, patient had lumbar puncture.  He received 3 and half liters of normal saline so far and now he is alert but going back to sleep because he received Ativan for LP.  But according to mom he is much better than when he came.  he said that he had a headache for last 2 days but no blurred vision, no neck stiffness..  Now blood pressure after treatment of liters of fluid is 89/97, heart rate 128, O2 saturation 95% on room air.  His mother says she was  a Equities trader, she told that she thinks he might have had a small seizure PAST MEDICAL HISTORY:  History reviewed. No pertinent past medical history.  PAST SURGICAL HISTOIRY:  History reviewed. No pertinent surgical history. Whipple  procedure for pancreatic cancer 2 years ago.. SOCIAL HISTORY:   Social History   Tobacco Use  . Smoking status: Never Smoker  . Smokeless tobacco: Never Used  Substance Use Topics  . Alcohol use: Yes    Alcohol/week: 0.0 - 0.6 oz    Comment: occasional    FAMILY HISTORY:  History reviewed. No pertinent family history.  DRUG ALLERGIES:    Allergies  Allergen Reactions  . Iodinated Diagnostic Agents Other (See Comments) and Shortness Of Breath    Patient had a breakthrough reaction to IV CT contrast despite a 13 hour prep.  The patient should not have iodinated CT contrast William again due to a breakthrough reaction.  . Shellfish Allergy Other (See Comments)    Shellfish-Migraine. Does fine with CT scans.    REVIEW OF SYSTEMS:  CONSTITUTIONAL: Fever, fatigue, generalized weakness EYES: No blurred or double vision..head ache EARS, NOSE, AND THROAT: No tinnitus or ear pain.  RESPIRATORY: No cough, shortness of breath, wheezing or hemoptysis.  CARDIOVASCULAR: No chest pain, orthopnea, edema.  GASTROINTESTINAL: No nausea, vomiting, diarrhea or abdominal pain.  ut vomited in the emergency room. GENITOURINARY: No dysuria, hematuria.  ENDOCRINE: No polyuria, nocturia,  HEMATOLOGY: No anemia, easy bruising or bleeding SKIN: No rash or lesion. MUSCULOSKELETAL: No joint pain or arthritis.   NEUROLOGIC: No tingling, numbness, weakness.  PSYCHIATRY: No anxiety or depression.   MEDICATIONS AT HOME:   Prior to Admission medications   Medication Sig Start Date End Date Taking? Authorizing Provider  ARGININE PO Take 1 tablet by mouth daily.    Yes [provider]  Cholecalciferol (VITAMIN D3) 50000 units CAPS Take 1 capsule by mouth once a week. 02/20/17  Yes [provider]  lipase/protease/amylase (CREON) 36000 UNITS CPEP capsule Take 6 capsules with meals and 3 caps with  snacks 12/19/16  Yes [provider]  Multiple Vitamins-Minerals (MULTIVITAMIN ADULT PO) Take 1 tablet by mouth daily.    Yes [provider]  Omega-3 Fatty Acids (FISH OIL PO) Take 1 tablet by mouth daily.    Yes [provider]      VITAL SIGNS:  Blood pressure (!) 88/59, pulse (!) 127, resp. rate (!) 28, height 6\' 3"  (1.905 m), weight 104.3 kg (230 lb), SpO2 92 %.  PHYSICAL EXAMINATION:  GENERAL:  47  y.o.-year-old patient lying in the bed with no acute distress.  EYES: Pupils equal, round, reactive to light and accommodation. No scleral icterus. Extraocular muscles intact.  HEENT: Head atraumatic, normocephalic. Oropharynx and nasopharynx clear.  NECK:  Supple, no jugular venous distention. No thyroid enlargement, no tenderness.  LUNGS: Normal breath sounds bilaterally, no wheezing, rales,rhonchi or crepitation. No use of accessory muscles of respiration.  CARDIOVASCULAR: S1, S2 normal. No murmurs, rubs, or gallops.  ABDOMEN: Soft, nontender, nondistended. Bowel sounds present. No organomegaly or mass.  EXTREMITIES: No pedal edema, cyanosis, or clubbing.  NEUROLOGIC: Cranial nerves II through XII are intact. Muscle strength 5/5 in all extremities. Sensation intact. Gait not checked.  PSYCHIATRIC: The patient is alert and oriented x 3.  SKIN; dry mucosa.  LABORATORY PANEL:   CBC Recent Labs  Lab 03/20/17 1550  WBC 4.6  HGB 14.4  HCT 42.7  PLT 192   ------------------------------------------------------------------------------------------------------------------  Chemistries  Recent Labs  Lab 03/20/17 1550  NA 129*  K 4.3  CL 95*  CO2 20*  GLUCOSE 113*  BUN 19  CREATININE 1.37*  CALCIUM 8.3*  AST 93*  ALT 104*  ALKPHOS 160*  BILITOT 1.4*   ------------------------------------------------------------------------------------------------------------------  Cardiac Enzymes Recent Labs  Lab 03/20/17 1550  TROPONINI <0.03   ------------------------------------------------------------------------------------------------------------------  RADIOLOGY:  Ct Head Wo Contrast  Result Date: 03/20/2017 CLINICAL DATA:  Unresponsive, history of pancreatic carcinoma EXAM: CT HEAD WITHOUT CONTRAST TECHNIQUE: Contiguous axial images were obtained from the base of the skull through the vertex without intravenous contrast. COMPARISON:  None. FINDINGS: Brain: No evidence of acute  infarction, hemorrhage, hydrocephalus, extra-axial collection or mass lesion/mass effect. Vascular: No hyperdense vessel or unexpected calcification. Skull: Normal. Negative for fracture or focal lesion. Sinuses/Orbits: No acute finding. Other: None. IMPRESSION: No acute intracranial abnormality noted. Electronically Signed   By: Inez Catalina M.D.   On: 03/20/2017 16:27   Dg Chest Port 1 View  Result Date: 03/20/2017 CLINICAL DATA:  Hypoxia, fever and altered mental status. EXAM: PORTABLE CHEST 1 VIEW COMPARISON:  02/09/2018 FINDINGS: Poor inspiratory effort. Heart size is normal. Allowing for the poor inspiration, the lungs are probably clear. No edema or effusions. No acute bone finding. IMPRESSION: Poor inspiration.  No active disease suspected allowing for that. Electronically Signed   By: Nelson Chimes M.D.   On: 03/20/2017 16:14    EKG:   Orders placed or performed during the hospital encounter of 03/20/17  . ED EKG 12-Lead  . ED EKG 12-Lead  EKG showed sinus tachycardia 180 bpm when he came, later on showed sinus tachycardia up to 120 bpm.  IMPRESSION AND PLAN:   47 year old male patient with history of pancreatic cancer status post Whipple procedure comes in with high fever, altered mental status and sepsis with elevated lactic acid.  1.  Septic shock present on admission with elevated lactic acid, tachycardia, mild acute kidney injury with mild  metabolic acidosis, patient bicarb is around 20.  Mid to intensive care unit, started on IV  fluids with Ringer lactate 125 cc an hour, follow blood cultures, continue empiric antibiotics with vancomycin, Zosyn.  Discussed the case with Dr. Vertell Novak over the phone.  Also has hyponatremia: Continue IV fluids.  #2. altered mental status with a headache: High fever: Patient had LP, follow results, patient is alert and awake after 3 L of fluid.  And he is able to communicate better.  CT of the head unremarkable.  Because of history of pancreatic cancer  patient needs MRI but patient has allergies to shellfish, family says that even the prednisone patient gotten anaphylaxis.consider neuro consult if dont improve   Severe sinus tachycardia due to dehydration: Improved with IV hydration, heart rate decreased from 180-120.  #4 history of pancreatic cancer status post Whipple procedure, patient is on Creon supplements at home.  Follows up at [facility] for surveillance.   Discussed with patient's mother.  All the records are reviewed and case discussed with ED provider. Management plans discussed metabolic acidosis patient, family and they are in agreement.  CODE STATUS:full TOTAL TIME TAKING CARE OF THIS PATIENT: 35minutes.    Epifanio Lesches M.D on 03/20/2017 at 6:33 PM  Between 7am to 6pm - Pager - [phone]  After 6pm go to [email or web address] - password [email or web address] [facility]  Office  [phone]  CC: Primary care physician; Stoney Bang, MD  Note: This dictation was prepared with Dragon dictation along with smaller phrase technology. Any transcriptional errors that result from this process are unintentional.

## 2017-03-20 NOTE — Progress Notes (Signed)
Pharmacy Antibiotic Note  William Lara is a 47 y.o. male admitted on 03/20/2017 with sepsis.  Pharmacy has been consulted for zosyn and vancomycin dosing.  Plan: Vancomycin 1500mg  IV every 12 hours.  Goal trough 15-20 mcg/mL. Zosyn 3.375g IV q8h (4 hour infusion).  Height: 6\' 3"  (190.5 cm) Weight: 230 lb (104.3 kg) IBW/kg (Calculated) : 84.5  No data recorded.  Recent Labs  Lab 03/20/17 1550 03/20/17 1552  WBC 4.6  --   CREATININE 1.37*  --   LATICACIDVEN  --  3.5*    Estimated Creatinine Clearance: 89 mL/min (A) (by C-G formula based on SCr of 1.37 mg/dL (H)).    Allergies  Allergen Reactions  . Iodinated Diagnostic Agents Other (See Comments) and Shortness Of Breath    Patient had a breakthrough reaction to IV CT contrast despite a 13 hour prep.  The patient should not have iodinated CT contrast ever again due to a breakthrough reaction.  . Shellfish Allergy Other (See Comments)    Shellfish-Migraine. Does fine with CT scans.    Antimicrobials this admission: Anti-infectives (From admission, onward)   Start     Dose/Rate Route Frequency Ordered Stop   03/20/17 2100  vancomycin (VANCOCIN) 1,500 mg in sodium chloride 0.9 % 500 mL IVPB     1,500 mg 250 mL/hr over 120 Minutes Intravenous Every 12 hours 03/20/17 1826     03/20/17 2000  piperacillin-tazobactam (ZOSYN) IVPB 3.375 g     3.375 g 12.5 mL/hr over 240 Minutes Intravenous Every 8 hours 03/20/17 1750     03/20/17 1600  piperacillin-tazobactam (ZOSYN) IVPB 3.375 g     3.375 g 100 mL/hr over 30 Minutes Intravenous  Once 03/20/17 1549 03/20/17 1702   03/20/17 1600  vancomycin (VANCOCIN) IVPB 1000 mg/200 mL premix     1,000 mg 200 mL/hr over 60 Minutes Intravenous  Once 03/20/17 1549 03/20/17 1754      Microbiology results: No results found for this or any previous visit (from the past 240 hour(s)).  Thank you for allowing pharmacy to be a part of this patient's care.  William Lara 03/20/2017 6:28 PM

## 2017-03-20 NOTE — Progress Notes (Signed)
eLink Physician-Brief Progress Note Patient Name: [name] DOB: 1970-04-10 MRN: [id number]   Date of Service  03/20/2017  HPI/Events of Note  Fever, encephalopathy , CSF neg Being treated as sepsis syndrome, unclear source  eICU Interventions  Lactate clearing On levophed gtt     Intervention Category Evaluation Type: New Patient Evaluation  William Lara V. William Lara 03/20/2017, 10:08 PM

## 2017-03-20 NOTE — Consult Note (Signed)
Name: William Lara MRN: [id number] DOB: 1970/10/21    ADMISSION DATE:  03/20/2017 CONSULTATION DATE: 03/20/2017  REFERRING MD : Dr. Vianne Bulls   CHIEF COMPLAINT: Altered Mental Status   BRIEF PATIENT DESCRIPTION:  47 yo male admitted with acute encephalopathy and septic shock of unknown etiology with hypotension requiring levophed gtt   SIGNIFICANT EVENTS  01/2-Pt admitted to ICU   STUDIES:  CT Head 01/2>>No acute intracranial abnormality noted.  HISTORY OF PRESENT ILLNESS:   This is a 47 yo male with a PMH of Pancreatic Cancer s/p Whipple Procedure (03/25/2015 at [facility]), HTN, PE on chronic anticoagulation and Former Smoker.  He presented to [facility] ER via wheelchair from [facility] with altered mental status, elevated heart rate 180's, febrile with temp 103 F, and hypoxia O2 sats 90% on RA.  Per ER notes the pt has had fever, chills, and generalized weakness for 2-3 days prior to presentation to the ER.  In the ER the pt was minimally responsive to sternal rub and unable to follow commands or answer questions.  Due to concerns encephalopathy may be secondary to outpatient pain medication he received a dose of Narcan in the ER and following administration pt was more awake, however remained confused.  Lab results ruled pt in for sepsis, therefore he received IV fluids and IV abx.  An LP was performed and CSF appeared clear, influenza test negative, and CXR results negative.   Despite aggressive fluid resuscitation the pt remained hypotensive requiring levophed gtt.  He was subsequently admitted to ICU by hospitalist team for further workup and treatment PCCM consulted.   PAST MEDICAL HISTORY :   has no past medical history on file.  has no past surgical history on file. Prior to Admission medications   Medication Sig Start Date End Date Taking? Authorizing Provider  ARGININE PO Take 1 tablet by mouth daily.    Yes [provider]  Cholecalciferol (VITAMIN D3) 50000 units CAPS  Take 1 capsule by mouth once a week. 02/20/17  Yes [provider]  lipase/protease/amylase (CREON) 36000 UNITS CPEP capsule Take 6 capsules with meals and 3 caps with snacks 12/19/16  Yes [provider]  Multiple Vitamins-Minerals (MULTIVITAMIN ADULT PO) Take 1 tablet by mouth daily.    Yes [provider]  Omega-3 Fatty Acids (FISH OIL PO) Take 1 tablet by mouth daily.    Yes [provider]   Allergies  Allergen Reactions  . Iodinated Diagnostic Agents Anaphylaxis, Shortness Of Breath and Other (See Comments)    Patient had a breakthrough reaction to IV CT contrast despite a 13 hour prep.  The patient should not have iodinated CT contrast ever again due to a breakthrough reaction.  . Shellfish Allergy Other (See Comments)    Shellfish-Migraine. Does fine with CT scans.    FAMILY HISTORY:  family history is not on file. SOCIAL HISTORY:  reports that  has never smoked. he has never used smokeless tobacco. He reports that he drinks alcohol. He reports that he does not use drugs.  REVIEW OF SYSTEMS:  Positives in BOLD  Constitutional: fever, chills, weight loss, malaise/fatigue and diaphoresis.  HENT: hearing loss, ear pain, nosebleeds, congestion, sore throat, neck pain, headache, tinnitus and ear discharge.   Eyes: blurred vision, double vision, photophobia, pain, discharge and redness.  Respiratory: cough, hemoptysis, sputum production, shortness of breath, wheezing and stridor.   Cardiovascular: Negative for chest pain, palpitations, orthopnea, claudication, leg swelling and PND.  Gastrointestinal: Negative for heartburn, nausea,  vomiting, abdominal pain, diarrhea, constipation, blood in stool and melena.  Genitourinary: Negative for dysuria, urgency, frequency, hematuria and flank pain.  Musculoskeletal: Negative for myalgias, back pain, joint pain and falls.  Skin: Negative for itching and rash.  Neurological: Negative for dizziness, tingling,  tremors, sensory change, speech change, focal weakness, seizures, loss of consciousness, weakness and headaches.  Endo/Heme/Allergies: Negative for environmental allergies and polydipsia. Does not bruise/bleed easily.  SUBJECTIVE:  Pt states he is sleepy.  VITAL SIGNS: Temp:  [99 F (37.2 C)-103 F (39.4 C)] 99 F (37.2 C) (01/02 1735) Pulse Rate:  [118-184] 121 (01/02 2020) Resp:  [15-30] 15 (01/02 2020) BP: (73-109)/(51-79) 108/67 (01/02 2020) SpO2:  [92 %-99 %] 95 % (01/02 2020) Weight:  [104.3 kg (230 lb)] 104.3 kg (230 lb) (01/02 1603)  PHYSICAL EXAMINATION: General: well developed, well nourished, NAD  Neuro: lethargic, follows commands, PERRLA  HEENT: supple, no JVD Cardiovascular: sinus tach, s1s2, no M/R/G Lungs: clear throughout, even, non labored  Abdomen: +BS x4, soft, non tender, non distended  Musculoskeletal: normal bulk and tone, no edema  Skin: intact no rashes or lesions   Recent Labs  Lab 03/20/17 1550  NA 129*  K 4.3  CL 95*  CO2 20*  BUN 19  CREATININE 1.37*  GLUCOSE 113*   Recent Labs  Lab 03/20/17 1550  HGB 14.4  HCT 42.7  WBC 4.6  PLT 192   Ct Head Wo Contrast  Result Date: 03/20/2017 CLINICAL DATA:  Unresponsive, history of pancreatic carcinoma EXAM: CT HEAD WITHOUT CONTRAST TECHNIQUE: Contiguous axial images were obtained from the base of the skull through the vertex without intravenous contrast. COMPARISON:  None. FINDINGS: Brain: No evidence of acute infarction, hemorrhage, hydrocephalus, extra-axial collection or mass lesion/mass effect. Vascular: No hyperdense vessel or unexpected calcification. Skull: Normal. Negative for fracture or focal lesion. Sinuses/Orbits: No acute finding. Other: None. IMPRESSION: No acute intracranial abnormality noted. Electronically Signed   By: Inez Catalina M.D.   On: 03/20/2017 16:27   Dg Chest Port 1 View  Result Date: 03/20/2017 CLINICAL DATA:  Hypoxia, fever and altered mental status. EXAM: PORTABLE  CHEST 1 VIEW COMPARISON:  02/09/2018 FINDINGS: Poor inspiratory effort. Heart size is normal. Allowing for the poor inspiration, the lungs are probably clear. No edema or effusions. No acute bone finding. IMPRESSION: Poor inspiration.  No active disease suspected allowing for that. Electronically Signed   By: Nelson Chimes M.D.   On: 03/20/2017 16:14    ASSESSMENT / PLAN: Septic Shock etiology unknown Hypotension secondary to hypovolemia and sepsis Hyponatremia secondary to dehydration  Acute renal failure Lactic Acidosis Transaminitis  Hx: Pancreatic Cancer s/p Whipple, Pulmonary Embolism, and Hypotension   P: Supplemental O2 to maintain O2 sats >92% Prn CXR  Continuous telemetry monitoring  Trend WBC and monitor fever curve Trend PCT and lactic acid Continue broad spectrum abx Follow cultures  Prn levophed gtt to maintain map >65 Continue LR @125  ml/hr  Trend CMP Replace electrolytes as indicated Monitor UOP  Lovenox for VTE prophylaxis Trend CBC Monitor for s/sx of bleeding Transfuse for hgb <7 Urine drug screen results pending   Marda Stalker, [place] Pager [phone] (please enter 7 digits) PCCM Consult Pager [phone] (please enter 7 digits)

## 2017-03-21 ENCOUNTER — Encounter: Payer: Self-pay | Admitting: Infectious Diseases

## 2017-03-21 ENCOUNTER — Other Ambulatory Visit: Payer: Self-pay

## 2017-03-21 ENCOUNTER — Inpatient Hospital Stay: Payer: BLUE CROSS/BLUE SHIELD

## 2017-03-21 LAB — BLOOD GAS, ARTERIAL
ACID-BASE DEFICIT: 4.7 mmol/L — AB (ref 0.0–2.0)
Bicarbonate: 18.3 mmol/L — ABNORMAL LOW (ref 20.0–28.0)
O2 SAT: 94.2 %
PATIENT TEMPERATURE: 37
PO2 ART: 69 mmHg — AB (ref 83.0–108.0)
pCO2 arterial: 27 mmHg — ABNORMAL LOW (ref 32.0–48.0)
pH, Arterial: 7.44 (ref 7.350–7.450)

## 2017-03-21 LAB — CBC
HCT: 35.4 % — ABNORMAL LOW (ref 40.0–52.0)
Hemoglobin: 11.6 g/dL — ABNORMAL LOW (ref 13.0–18.0)
MCH: 28 pg (ref 26.0–34.0)
MCHC: 32.9 g/dL (ref 32.0–36.0)
MCV: 85 fL (ref 80.0–100.0)
Platelets: 143 10*3/uL — ABNORMAL LOW (ref 150–440)
RBC: 4.16 MIL/uL — AB (ref 4.40–5.90)
RDW: 14.9 % — ABNORMAL HIGH (ref 11.5–14.5)
WBC: 16.9 10*3/uL — AB (ref 3.8–10.6)

## 2017-03-21 LAB — BLOOD CULTURE ID PANEL (REFLEXED)
Acinetobacter baumannii: NOT DETECTED
CANDIDA ALBICANS: NOT DETECTED
Candida glabrata: NOT DETECTED
Candida krusei: NOT DETECTED
Candida parapsilosis: NOT DETECTED
Candida tropicalis: NOT DETECTED
Carbapenem resistance: NOT DETECTED
ENTEROBACTER CLOACAE COMPLEX: NOT DETECTED
ENTEROBACTERIACEAE SPECIES: DETECTED — AB
ENTEROCOCCUS SPECIES: NOT DETECTED
Escherichia coli: NOT DETECTED
HAEMOPHILUS INFLUENZAE: NOT DETECTED
Klebsiella oxytoca: DETECTED — AB
Klebsiella pneumoniae: DETECTED — AB
Listeria monocytogenes: NOT DETECTED
NEISSERIA MENINGITIDIS: NOT DETECTED
PSEUDOMONAS AERUGINOSA: NOT DETECTED
Proteus species: NOT DETECTED
STAPHYLOCOCCUS AUREUS BCID: NOT DETECTED
STREPTOCOCCUS AGALACTIAE: NOT DETECTED
STREPTOCOCCUS PYOGENES: NOT DETECTED
Serratia marcescens: NOT DETECTED
Staphylococcus species: NOT DETECTED
Streptococcus pneumoniae: NOT DETECTED
Streptococcus species: NOT DETECTED

## 2017-03-21 LAB — LACTIC ACID, PLASMA
LACTIC ACID, VENOUS: 4 mmol/L — AB (ref 0.5–1.9)
LACTIC ACID, VENOUS: 1.7 mmol/L (ref 0.5–1.9)
Lactic Acid, Venous: 4.9 mmol/L (ref 0.5–1.9)

## 2017-03-21 LAB — PROCALCITONIN: Procalcitonin: >150 ng/mL

## 2017-03-21 LAB — BASIC METABOLIC PANEL
Anion gap: 11 (ref 5–15)
BUN: 25 mg/dL — AB (ref 6–20)
CO2: 18 mmol/L — ABNORMAL LOW (ref 22–32)
CREATININE: 2.02 mg/dL — AB (ref 0.61–1.24)
Calcium: 7.1 mg/dL — ABNORMAL LOW (ref 8.9–10.3)
Chloride: 103 mmol/L (ref 101–111)
GFR calc non Af Amer: 38 mL/min — ABNORMAL LOW (ref >60)
GFR, EST AFRICAN AMERICAN: 44 mL/min — AB (ref >60)
Glucose, Bld: 150 mg/dL — ABNORMAL HIGH (ref 65–99)
Potassium: 4 mmol/L (ref 3.5–5.1)
SODIUM: 132 mmol/L — AB (ref 135–145)

## 2017-03-21 LAB — GLUCOSE, CAPILLARY: GLUCOSE-CAPILLARY: 152 mg/dL — AB (ref 65–99)

## 2017-03-21 MED ORDER — GADOBENATE DIMEGLUMINE 529 MG/ML IV SOLN
10.0000 mL | Freq: Once | INTRAVENOUS | Status: AC | PRN
  Administered 2017-03-21: 10 mL via INTRAVENOUS

## 2017-03-21 MED ORDER — IBUPROFEN 400 MG PO TABS
800.0000 mg | ORAL_TABLET | Freq: Three times a day (TID) | ORAL | Status: DC | PRN
  Administered 2017-03-21 – 2017-03-22 (×2): 800 mg via ORAL
  Filled 2017-03-21 (×2): qty 2

## 2017-03-21 MED ORDER — SODIUM CHLORIDE 0.9 % IV BOLUS (SEPSIS)
1000.0000 mL | Freq: Once | INTRAVENOUS | Status: AC
  Administered 2017-03-21: 1000 mL via INTRAVENOUS

## 2017-03-21 MED ORDER — METRONIDAZOLE IN NACL 5-0.79 MG/ML-% IV SOLN
500.0000 mg | Freq: Three times a day (TID) | INTRAVENOUS | Status: DC
  Administered 2017-03-21 – 2017-03-22 (×4): 500 mg via INTRAVENOUS
  Filled 2017-03-21 (×6): qty 100

## 2017-03-21 MED ORDER — DEXTROSE 5 % IV SOLN
2.0000 g | Freq: Three times a day (TID) | INTRAVENOUS | Status: DC
  Administered 2017-03-21 – 2017-03-22 (×4): 2 g via INTRAVENOUS
  Filled 2017-03-21 (×7): qty 2

## 2017-03-21 MED ORDER — DEXTROSE 5 % IV SOLN
2.0000 g | Freq: Two times a day (BID) | INTRAVENOUS | Status: DC
  Filled 2017-03-21 (×2): qty 2

## 2017-03-21 MED ORDER — TRAZODONE HCL 50 MG PO TABS
50.0000 mg | ORAL_TABLET | Freq: Once | ORAL | Status: AC
  Administered 2017-03-21: 50 mg via ORAL
  Filled 2017-03-21: qty 1

## 2017-03-21 MED ORDER — DEXTROSE 5 % IV SOLN
2.0000 g | Freq: Three times a day (TID) | INTRAVENOUS | Status: DC
  Filled 2017-03-21: qty 2

## 2017-03-21 NOTE — Progress Notes (Signed)
Pharmacy Antibiotic Note  William Lara is a 47 y.o. male admitted on 03/20/2017 with sepsis.  Pharmacy has been consulted for cefepime dosing.  Plan: Will start patient on cefepime 2g IV q8h   Dose switched to cefepime 2g IV q12h due to reduction in renal function CrCl < 60 ml/min  Height: 6\' 4"  (193 cm) Weight: 216 lb 7.9 oz (98.2 kg) IBW/kg (Calculated) : 86.8  Temp (24hrs), Avg:99.6 F (37.6 C), Min:98.4 F (36.9 C), Max:103 F (39.4 C)  Recent Labs  Lab 03/20/17 1550 03/20/17 1552 03/20/17 1749 03/21/17 0006 03/21/17 0302 03/21/17 0304  WBC 4.6  --   --   --   --  16.9*  CREATININE 1.37*  --   --   --   --  2.02*  LATICACIDVEN  --  3.5* 2.6* 4.9* 4.0*  --     Estimated Creatinine Clearance: 56.7 mL/min (A) (by C-G formula based on SCr of 2.02 mg/dL (H)).    Allergies  Allergen Reactions  . Iodinated Diagnostic Agents Anaphylaxis, Shortness Of Breath and Other (See Comments)    Patient had a breakthrough reaction to IV CT contrast despite a 13 hour prep.  The patient should not have iodinated CT contrast ever again due to a breakthrough reaction.  . Shellfish Allergy Other (See Comments)    Shellfish-Migraine. Does fine with CT scans.    Thank you for allowing pharmacy to be a part of this patient's care.  Tobie Lords, PharmD, BCPS Clinical Pharmacist 03/21/2017

## 2017-03-21 NOTE — Progress Notes (Signed)
Pharmacy Antibiotic Note  FERRY MATTHIS is a 47 y.o. male admitted on 03/20/2017 with bacteremia.  Pharmacy has been consulted for cefazolin dosing. Patient with Klebsiella oxytoca and Klebsiella pneumoniae on BCID. Patient with extensive healthcare history including pancreatic cancer s/p Whipple procedure.   Plan: Per ID will continue broad spectrum coverage in setting of likely polymicrobial infection.   Will continue cefepime 2g IV Q8hr. Will continue to monitor creatinine clearance; patient is borderline for renal adjustment - will continue aggressive dosing during this immediate acute period.   Will initiate metronidazole 500mg  IV Q8hr.    Height: 6\' 4"  (193 cm) Weight: 224 lb 13.9 oz (102 kg) IBW/kg (Calculated) : 86.8  Temp (24hrs), Avg:99.8 F (37.7 C), Min:97.8 F (36.6 C), Max:103 F (39.4 C)  Recent Labs  Lab 03/20/17 1550 03/20/17 1552 03/20/17 1749 03/21/17 0006 03/21/17 0302 03/21/17 0304 03/21/17 1209  WBC 4.6  --   --   --   --  16.9*  --   CREATININE 1.37*  --   --   --   --  2.02*  --   LATICACIDVEN  --  3.5* 2.6* 4.9* 4.0*  --  1.7    Estimated Creatinine Clearance: 56.7 mL/min (A) (by C-G formula based on SCr of 2.02 mg/dL (H)).    Allergies  Allergen Reactions  . Iodinated Diagnostic Agents Anaphylaxis, Shortness Of Breath and Other (See Comments)    Patient had a breakthrough reaction to IV CT contrast despite a 13 hour prep.  The patient should not have iodinated CT contrast ever again due to a breakthrough reaction.  . Shellfish Allergy Other (See Comments)    Shellfish-Migraine. Does fine with CT scans.    Antimicrobials this admission: Zosyn 1/2 X 1  Vancomycin 1/2 >> 1/3 Cefepime 1/3 >>  Metronidazole 1/3 >>   Dose adjustments this admission: 1/3 readjusted to Q8hr dosing.   Microbiology results: 1/2 BCx: Klebsiella oxytoca and Klebsiella pneumoniae  1/2 UCx: sent  1/2 CSF: no growth < 12 hours  1/2 MRSA PCR: negative  1/3 PCT: >  150   Thank you for allowing pharmacy to be a part of this patient's care.  Simpson,Michael L 03/21/2017 2:56 PM

## 2017-03-21 NOTE — Care Management Note (Signed)
Case Management Note  Patient Details  Name: William Lara MRN: [id number] Date of Birth: 1970/05/11  Subjective/Objective:                 Admitted to icu with septic shock. Cultures pending. IV antibiotics, ivf and Levophed. Previous hx whipple procedure for pancreatic cancer. Prior to this admission independent in his adls, current with pcp and no issues accessing medical care.   Action/Plan:   Expected Discharge Date:  03/22/17               Expected Discharge Plan:     In-House Referral:     Discharge planning Services     Post Acute Care Choice:    Choice offered to:     DME Arranged:    DME Agency:     HH Arranged:    HH Agency:     Status of Service:     If discussed at [employer], dates discussed:    Additional Comments:  Katrina Stack, RN 03/21/2017, 12:01 PM

## 2017-03-21 NOTE — Progress Notes (Signed)
[facility] [place] NAME: William Lara    MR#:  [id number]  DATE OF BIRTH:  [birth year]  SUBJECTIVE:  CHIEF COMPLAINT:   Chief Complaint  Patient presents with  . Altered Mental Status    Brought with altered mental status and fever, also had some complaint of headache.  More alert today, still some lethargic, having low-grade fever now.  LP did not show any evidence of infection. Multiple liver mass per ultrasound.  REVIEW OF SYSTEMS:  CONSTITUTIONAL: No fever, fatigue or weakness.  EYES: No blurred or double vision.  EARS, NOSE, AND THROAT: No tinnitus or ear pain.  RESPIRATORY: No cough, shortness of breath, wheezing or hemoptysis.  CARDIOVASCULAR: No chest pain, orthopnea, edema.  GASTROINTESTINAL: No nausea, vomiting, diarrhea or abdominal pain.  GENITOURINARY: No dysuria, hematuria.  ENDOCRINE: No polyuria, nocturia,  HEMATOLOGY: No anemia, easy bruising or bleeding SKIN: No rash or lesion. MUSCULOSKELETAL: No joint pain or arthritis.   NEUROLOGIC: No tingling, numbness, weakness.  PSYCHIATRY: No anxiety or depression.   ROS  DRUG ALLERGIES:   Allergies  Allergen Reactions  . Iodinated Diagnostic Agents Anaphylaxis, Shortness Of Breath and Other (See Comments)    Patient had a breakthrough reaction to IV CT contrast despite a 13 hour prep.  The patient should not have iodinated CT contrast ever again due to a breakthrough reaction.  . Shellfish Allergy Other (See Comments)    Shellfish-Migraine. Does fine with CT scans.    VITALS:  Blood pressure (!) 90/59, pulse 100, temperature 97.8 F (36.6 C), temperature source Oral, resp. rate 18, height 6\' 4"  (1.93 m), weight 102 kg (224 lb 13.9 oz), SpO2 97 %.  PHYSICAL EXAMINATION:  GENERAL:  47 y.o.-year-old patient lying in the bed with no acute distress.  EYES: Pupils equal, round, reactive to light and accommodation. No scleral icterus. Extraocular muscles intact.   HEENT: Head atraumatic, normocephalic. Oropharynx and nasopharynx clear.  NECK:  Supple, no jugular venous distention. No thyroid enlargement, no tenderness.  LUNGS: Normal breath sounds bilaterally, no wheezing, rales,rhonchi or crepitation. No use of accessory muscles of respiration.  CARDIOVASCULAR: S1, S2 normal. No murmurs, rubs, or gallops.  ABDOMEN: Soft, nontender, nondistended. Bowel sounds present. No organomegaly or mass.  EXTREMITIES: No pedal edema, cyanosis, or clubbing.  NEUROLOGIC: Cranial nerves II through XII are intact. Muscle strength 4/5 in all extremities. Sensation intact. Gait not checked.  PSYCHIATRIC: The patient is alert and oriented x 3.  SKIN: No obvious rash, lesion, or ulcer.   Physical Exam LABORATORY PANEL:   CBC Recent Labs  Lab 03/21/17 0304  WBC 16.9*  HGB 11.6*  HCT 35.4*  PLT 143*   ------------------------------------------------------------------------------------------------------------------  Chemistries  Recent Labs  Lab 03/20/17 1550 03/21/17 0304  NA 129* 132*  K 4.3 4.0  CL 95* 103  CO2 20* 18*  GLUCOSE 113* 150*  BUN 19 25*  CREATININE 1.37* 2.02*  CALCIUM 8.3* 7.1*  AST 93*  --   ALT 104*  --   ALKPHOS 160*  --   BILITOT 1.4*  --    ------------------------------------------------------------------------------------------------------------------  Cardiac Enzymes Recent Labs  Lab 03/20/17 1550  TROPONINI <0.03   ------------------------------------------------------------------------------------------------------------------  RADIOLOGY:  Ct Head Wo Contrast  Result Date: 03/20/2017 CLINICAL DATA:  Unresponsive, history of pancreatic carcinoma EXAM: CT HEAD WITHOUT CONTRAST TECHNIQUE: Contiguous axial images were obtained from the base of the skull through the vertex without intravenous contrast. COMPARISON:  None. FINDINGS: Brain: No evidence of acute  infarction, hemorrhage, hydrocephalus, extra-axial collection or  mass lesion/mass effect. Vascular: No hyperdense vessel or unexpected calcification. Skull: Normal. Negative for fracture or focal lesion. Sinuses/Orbits: No acute finding. Other: None. IMPRESSION: No acute intracranial abnormality noted. Electronically Signed   By: Inez Catalina M.D.   On: 03/20/2017 16:27   Dg Chest Port 1 View  Result Date: 03/21/2017 CLINICAL DATA:  Acute respiratory failure. EXAM: PORTABLE CHEST 1 VIEW COMPARISON:  03/20/2017 FINDINGS: Heart size is normal. Mediastinal shadows are normal. Patient has taken a poor inspiration. Allowing for that, the lungs are probably clear. There could be mild basilar atelectasis. No dense consolidation or lobar collapse. No effusions. IMPRESSION: Poor inspiration.  Allowing for that, no active disease is suspected Electronically Signed   By: Nelson Chimes M.D.   On: 03/21/2017 06:45   Dg Chest Port 1 View  Result Date: 03/20/2017 CLINICAL DATA:  Hypoxia, fever and altered mental status. EXAM: PORTABLE CHEST 1 VIEW COMPARISON:  02/09/2018 FINDINGS: Poor inspiratory effort. Heart size is normal. Allowing for the poor inspiration, the lungs are probably clear. No edema or effusions. No acute bone finding. IMPRESSION: Poor inspiration.  No active disease suspected allowing for that. Electronically Signed   By: Nelson Chimes M.D.   On: 03/20/2017 16:14   US Abdomen Limited Ruq  Result Date: 03/21/2017 CLINICAL DATA:  Status post Whipple procedure for pancreatic cancer. EXAM: ULTRASOUND ABDOMEN LIMITED RIGHT UPPER QUADRANT COMPARISON:  None. FINDINGS: Gallbladder: Status post cholecystectomy. Common bile duct: Diameter: 9 mm which is mildly dilated most consistent with post cholecystectomy status. Liver: Increased echogenicity of hepatic parenchyma is noted. Intrahepatic biliary dilatation is noted. Multiple low densities are noted throughout hepatic parenchyma consistent with metastatic disease, the largest measuring 4.4 cm in right hepatic lobe. Portal vein  is patent on color Doppler imaging with normal direction of blood flow towards the liver. IMPRESSION: Status post cholecystectomy. Mild intrahepatic and extrahepatic biliary dilatation is noted which may be due to post cholecystectomy status, but distal common bile duct obstruction cannot be excluded, and correlation with liver function tests is noted. Multiple hepatic masses are noted consistent with metastatic disease, the largest measuring 4.4 cm in right hepatic lobe. Electronically Signed   By: Marijo Conception, M.D.   On: 03/21/2017 09:05    ASSESSMENT AND PLAN:   Active Problems:   Septic shock ([place])  47 year old male patient with history of pancreatic cancer status post Whipple procedure comes in with high fever, altered mental status and sepsis with elevated lactic acid.  1.  Septic shock - unknown etiology present on admission with elevated lactic acid, tachycardia, mild acute kidney injury with mild  metabolic acidosis, patient bicarb is around 20.    on IV fluids with Ringer lactate 125 cc an hour, follow blood cultures, continue empiric antibiotics with vancomycin, Zosyn.     Also has hyponatremia: Continue IV fluids.  have some liver masses on [place] abd- need MRi to r/o abscess.  #2. altered mental status with a headache: High fever:  Improved now,. LP is negative for infection cT head negative.  # Severe sinus tachycardia due to dehydration: Improved with IV hydration, heart rate decreased from 180- to100.  #4 history of pancreatic cancer status post Whipple procedure, patient is on Creon supplements at home.  Follows up at [facility] for surveillance.   Discussed with patient's mother.    All the records are reviewed and case discussed with Care Management/Social Workerr. Management plans discussed with the patient, family and  they are in agreement.  CODE STATUS: Full.  TOTAL TIME TAKING CARE OF THIS PATIENT: 35 minutes.   POSSIBLE D/C IN 1-2 DAYS, DEPENDING ON CLINICAL  CONDITION.   Vaughan Basta M.D on 03/21/2017   Between 7am to 6pm - Pager - [phone]  After 6pm go to [email or web address] - password [email or web address] [facility]  Office  [phone]  CC: Primary care physician; Pauline Good Thornell Mule, MD  Note: This dictation was prepared with Dragon dictation along with smaller phrase technology. Any transcriptional errors that result from this process are unintentional.

## 2017-03-21 NOTE — Consult Note (Signed)
[facility] Infectious Disease     Reason for Consult: GNR bacteremia    Referring Physician:  Mortimer Fries, D Date of Admission:  03/20/2017   Active Problems:   Septic shock ([place])   HPI: William Lara is a 46 y.o. male with history of previous pancreatic cancer status post Whipple procedure 2 years ago comes in brought by family because of altered mental status and fevers for 3 days associated with diffuse body aches, weakness.  On admit T 103, wbc 17. BCX + Kleb PNA and Oxytoca.  USS with liver lesioins. He is feeling a little better since admit.    Past Medical History:  Diagnosis Date  . Essential hypertension 05/09/2011  . Hyperthyroidism without crisis 02/16/2016  . Malignant tumor of head of pancreas ([place]) 10/19/2014  . Obstructive sleep apnea 05/09/2011  . Septic shock ([place]) 03/20/2017   History reviewed. No pertinent surgical history. Social History   Tobacco Use  . Smoking status: Never Smoker  . Smokeless tobacco: Never Used  Substance Use Topics  . Alcohol use: Yes    Alcohol/week: 0.0 - 0.6 oz    Comment: occasional  . Drug use: No   History reviewed. No pertinent family history.  Allergies:  Allergies  Allergen Reactions  . Iodinated Diagnostic Agents Anaphylaxis, Shortness Of Breath and Other (See Comments)    Patient had a breakthrough reaction to IV CT contrast despite a 13 hour prep.  The patient should not have iodinated CT contrast ever again due to a breakthrough reaction.  . Shellfish Allergy Other (See Comments)    Shellfish-Migraine. Does fine with CT scans.    Current antibiotics: Antibiotics Given (last 72 hours)    Date/Time Action Medication Dose Rate   03/20/17 1632 New Bag/Given   piperacillin-tazobactam (ZOSYN) IVPB 3.375 g 3.375 g 100 mL/hr   03/20/17 1654 New Bag/Given   vancomycin (VANCOCIN) IVPB 1000 mg/200 mL premix 1,000 mg 200 mL/hr   03/20/17 2125 New Bag/Given   vancomycin (VANCOCIN) 1,500 mg in sodium chloride 0.9 % 500 mL IVPB  1,500 mg 250 mL/hr   03/20/17 2126 New Bag/Given   ceFEPIme (MAXIPIME) 2 g in dextrose 5 % 50 mL IVPB 2 g 100 mL/hr   03/21/17 0838 New Bag/Given   vancomycin (VANCOCIN) 1,500 mg in sodium chloride 0.9 % 500 mL IVPB 1,500 mg 250 mL/hr   03/21/17 1534 New Bag/Given   metroNIDAZOLE (FLAGYL) IVPB 500 mg 500 mg 100 mL/hr   03/21/17 1534 New Bag/Given   ceFEPIme (MAXIPIME) 2 g in dextrose 5 % 50 mL IVPB 2 g 100 mL/hr      MEDICATIONS: . enoxaparin (LOVENOX) injection  40 mg Subcutaneous Q24H  . lipase/protease/amylase  36,000 Units Oral TID AC  . mouth rinse  15 mL Mouth Rinse BID    Review of Systems - 11 systems reviewed and negative per HPI   OBJECTIVE: Temp:  [97.8 F (36.6 C)-102.1 F (38.9 C)] 97.9 F (36.6 C) (01/03 1630) Pulse Rate:  [90-132] 95 (01/03 1630) Resp:  [8-30] 19 (01/03 1630) BP: (75-109)/(50-84) 99/61 (01/03 1630) SpO2:  [94 %-100 %] 97 % (01/03 1630) Weight:  [98.2 kg (216 lb 7.9 oz)-102 kg (224 lb 13.9 oz)] 102 kg (224 lb 13.9 oz) (01/03 0500) Physical Exam  Constitutional: He is oriented to person, place, and time. He appears well-developed and well-nourished. No distress.  HENT:  Mouth/Throat: Oropharynx is clear and moist. No oropharyngeal exudate.  Cardiovascular: Normal rate, regular rhythm and normal heart sounds. Exam  reveals no gallop and no friction rub.  No murmur heard.  Pulmonary/Chest: Effort normal and breath sounds normal. No respiratory distress. He has no wheezes.  Abdominal: Soft. Bowel sounds are normal. He exhibits no distension. There is no tenderness.  Lymphadenopathy:  He has no cervical adenopathy.  Neurological: He is alert and oriented to person, place, and time.  Skin: Skin is warm and dry. No rash noted. No erythema.  Psychiatric: He has a normal mood and affect. His behavior is normal.     LABS: Results for orders placed or performed during the hospital encounter of 03/20/17 (from the past 48 hour(s))  Blood gas, arterial  ([facility], [facility], [facility])     Status: Abnormal (Preliminary result)   Collection Time: 03/20/17  3:49 PM  Result Value Ref Range   FIO2 0.44    Delivery systems NASAL CANNULA    Inspiratory PAP PENDING    Expiratory PAP PENDING    pH, Arterial 7.48 (H) 7.350 - 7.450   pCO2 arterial 27 (L) 32.0 - 48.0 mmHg   pO2, Arterial 74 (L) 83.0 - 108.0 mmHg   Bicarbonate 20.1 20.0 - 28.0 mmol/L   Acid-base deficit 2.2 (H) 0.0 - 2.0 mmol/L   O2 Saturation 95.8 %   Patient temperature 37.0    Collection site RIGHT RADIAL    Sample type ARTERIAL DRAW    Allens test (pass/fail) POSITIVE (A) PASS    Comment: Performed at [facility], [address] [place], [place] [address]  Comprehensive metabolic panel     Status: Abnormal   Collection Time: 03/20/17  3:50 PM  Result Value Ref Range   Sodium 129 (L) 135 - 145 mmol/L   Potassium 4.3 3.5 - 5.1 mmol/L    Comment: HEMOLYSIS AT THIS LEVEL MAY AFFECT RESULT   Chloride 95 (L) 101 - 111 mmol/L   CO2 20 (L) 22 - 32 mmol/L   Glucose, Bld 113 (H) 65 - 99 mg/dL   BUN 19 6 - 20 mg/dL   Creatinine, Ser 1.37 (H) 0.61 - 1.24 mg/dL   Calcium 8.3 (L) 8.9 - 10.3 mg/dL   Total Protein 7.3 6.5 - 8.1 g/dL   Albumin 3.2 (L) 3.5 - 5.0 g/dL   AST 93 (H) 15 - 41 U/L   ALT 104 (H) 17 - 63 U/L   Alkaline Phosphatase 160 (H) 38 - 126 U/L   Total Bilirubin 1.4 (H) 0.3 - 1.2 mg/dL   GFR calc non Af Amer >60 >60 mL/min   GFR calc Af Amer >60 >60 mL/min    Comment: (NOTE) The eGFR has been calculated using the CKD EPI equation. This calculation has not been validated in all clinical situations. eGFR's persistently <60 mL/min signify possible Chronic Kidney Disease.    Anion gap 14 5 - 15    Comment: Performed at [facility], [address] [place], [place] [address]  CBC WITH DIFFERENTIAL     Status: Abnormal   Collection Time: 03/20/17  3:50 PM  Result Value Ref Range   WBC 4.6 3.8 - 10.6 K/uL   RBC 5.02 4.40 - 5.90 MIL/uL   Hemoglobin 14.4 13.0 -  18.0 g/dL   HCT 42.7 40.0 - 52.0 %   MCV 85.0 80.0 - 100.0 fL   MCH 28.7 26.0 - 34.0 pg   MCHC 33.8 32.0 - 36.0 g/dL   RDW 14.8 (H) 11.5 - 14.5 %   Platelets 192 150 - 440 K/uL   Neutrophils Relative %  88 %   Neutro Abs 4.0 1.4 - 6.5 K/uL   Lymphocytes Relative 10 %   Lymphs Abs 0.5 (L) 1.0 - 3.6 K/uL   Monocytes Relative 2 %   Monocytes Absolute 0.1 (L) 0.2 - 1.0 K/uL   Eosinophils Relative 0 %   Eosinophils Absolute 0.0 0 - 0.7 K/uL   Basophils Relative 0 %   Basophils Absolute 0.0 0 - 0.1 K/uL    Comment: Performed at [facility], [address] [place], [place] [address]  Troponin I     Status: None   Collection Time: 03/20/17  3:50 PM  Result Value Ref Range   Troponin I <0.03 <0.03 ng/mL    Comment: Performed at [facility], [address] [place], [place] [address]  Procalcitonin - Baseline     Status: None   Collection Time: 03/20/17  3:50 PM  Result Value Ref Range   Procalcitonin 2.85 ng/mL    Comment:        Interpretation: PCT > 2 ng/mL: Systemic infection (sepsis) is likely, unless other causes are known. (NOTE)       Sepsis PCT Algorithm           Lower Respiratory Tract                                      Infection PCT Algorithm    ----------------------------     ----------------------------         PCT < 0.25 ng/mL                PCT < 0.10 ng/mL         Strongly encourage             Strongly discourage   discontinuation of antibiotics    initiation of antibiotics    ----------------------------     -----------------------------       PCT 0.25 - 0.50 ng/mL            PCT 0.10 - 0.25 ng/mL               OR       >80% decrease in PCT            Discourage initiation of                                            antibiotics      Encourage discontinuation           of antibiotics    ----------------------------     -----------------------------         PCT >= 0.50 ng/mL              PCT 0.26 - 0.50 ng/mL               AND        <80% decrease in PCT              Encourage initiation of                                             antibiotics       Encourage continuation  of antibiotics    ----------------------------     -----------------------------        PCT >= 0.50 ng/mL                  PCT > 0.50 ng/mL               AND         increase in PCT                  Strongly encourage                                      initiation of antibiotics    Strongly encourage escalation           of antibiotics                                     -----------------------------                                           PCT <= 0.25 ng/mL                                                 OR                                        > 80% decrease in PCT                                     Discontinue / Do not initiate                                             antibiotics Performed at [facility], [address] [place], [place] [address]   Blood Culture (routine x 2)     Status: None (Preliminary result)   Collection Time: 03/20/17  3:52 PM  Result Value Ref Range   Specimen Description      BLOOD LEFT ANTECUBITAL Performed at [facility], [address] [place], [place] [address]    Special Requests      BOTTLES DRAWN AEROBIC AND ANAEROBIC Blood Culture adequate volume Performed at [facility], [address] [place], [place] [address]    Culture  Setup Time      Organism ID to follow [place] CRITICAL RESULT CALLED TO, READ BACK BY AND VERIFIED WITH: [place] 03/21/17 ALV GRAM STAIN REVIEWED-AGREE WITH RESULT Performed at [facility], [address], [place], [place] [address]    Culture GRAM NEGATIVE RODS    Report Status PENDING   Blood Culture (routine x 2)     Status: None (Preliminary result)   Collection Time: 03/20/17  3:52 PM  Result  Value Ref Range   Specimen Description      BLOOD RIGHT  ANTECUBITAL Performed at [facility], [address] [place], [place] [address]    Special Requests      BOTTLES DRAWN AEROBIC AND ANAEROBIC Blood Culture adequate volume Performed at [facility], [address] [place], [place] [address]    Culture  Setup Time      Organism ID to follow GRAM NEGATIVE RODS IN BOTH AEROBIC AND ANAEROBIC BOTTLES CRITICAL RESULT CALLED TO, READ BACK BY AND VERIFIED WITH: DAVIID BESANTI AT 0407 03/21/17 ALV GRAM STAIN REVIEWED-AGREE WITH RESULT Performed at [facility], [place] [address] [place], [place] [address]    Culture GRAM NEGATIVE RODS    Report Status PENDING   Lactic acid, plasma     Status: Abnormal   Collection Time: 03/20/17  3:52 PM  Result Value Ref Range   Lactic Acid, Venous 3.5 (HH) 0.5 - 1.9 mmol/L    Comment: CRITICAL RESULT CALLED TO, READ BACK BY AND VERIFIED WITH ALICIA GRANGER AT 2025 ON 03/20/2017 JJB Performed at [facility], [address] [place], [place] [address] CORRECTED ON 01/02 AT 1854: PREVIOUSLY REPORTED AS 3.5 CRITICAL RESULT CALLED TO, READ BACK BY AND VERIFIED WITH ALICIA GRANGERN AT 2376 ON 03/20/2016 JJB   Blood Culture ID Panel (Reflexed)     Status: Abnormal   Collection Time: 03/20/17  3:52 PM  Result Value Ref Range   Enterococcus species NOT DETECTED NOT DETECTED   Listeria monocytogenes NOT DETECTED NOT DETECTED   Staphylococcus species NOT DETECTED NOT DETECTED   Staphylococcus aureus NOT DETECTED NOT DETECTED   Streptococcus species NOT DETECTED NOT DETECTED   Streptococcus agalactiae NOT DETECTED NOT DETECTED   Streptococcus pneumoniae NOT DETECTED NOT DETECTED   Streptococcus pyogenes NOT DETECTED NOT DETECTED   Acinetobacter baumannii NOT DETECTED NOT DETECTED   Enterobacteriaceae species DETECTED (A) NOT DETECTED    Comment: CRITICAL RESULT CALLED TO, READ BACK BY AND VERIFIED WITH: Tina Gruner BESANTI AT 0407 03/21/17 ALV    Enterobacter cloacae complex NOT DETECTED  NOT DETECTED   Escherichia coli NOT DETECTED NOT DETECTED   Klebsiella oxytoca DETECTED (A) NOT DETECTED    Comment: CRITICAL RESULT CALLED TO, READ BACK BY AND VERIFIED WITH: Sydne Krahl BESANTI AT 0407 03/21/17 ALV    Klebsiella pneumoniae DETECTED (A) NOT DETECTED    Comment: CRITICAL RESULT CALLED TO, READ BACK BY AND VERIFIED WITH: Ninoshka Wainwright BESANTI AT 0407 03/21/17 ALV    Proteus species NOT DETECTED NOT DETECTED   Serratia marcescens NOT DETECTED NOT DETECTED   Carbapenem resistance NOT DETECTED NOT DETECTED   Haemophilus influenzae NOT DETECTED NOT DETECTED   Neisseria meningitidis NOT DETECTED NOT DETECTED   Pseudomonas aeruginosa NOT DETECTED NOT DETECTED   Candida albicans NOT DETECTED NOT DETECTED   Candida glabrata NOT DETECTED NOT DETECTED   Candida krusei NOT DETECTED NOT DETECTED   Candida parapsilosis NOT DETECTED NOT DETECTED   Candida tropicalis NOT DETECTED NOT DETECTED    Comment: Performed at [facility], [address] [place], [place] [address]  Glucose, capillary     Status: Abnormal   Collection Time: 03/20/17  3:59 PM  Result Value Ref Range   Glucose-Capillary 119 (H) 65 - 99 mg/dL  Urinalysis, Routine w reflex microscopic     Status: Abnormal   Collection Time: 03/20/17  4:26 PM  Result Value Ref Range   Color, Urine AMBER (A) YELLOW    Comment: BIOCHEMICALS  MAY BE AFFECTED BY COLOR   APPearance HAZY (A) CLEAR   Specific Gravity, Urine 1.023 1.005 - 1.030   pH 5.0 5.0 - 8.0   Glucose, UA NEGATIVE NEGATIVE mg/dL   Hgb urine dipstick MODERATE (A) NEGATIVE   Bilirubin Urine NEGATIVE NEGATIVE   Ketones, ur NEGATIVE NEGATIVE mg/dL   Protein, ur NEGATIVE NEGATIVE mg/dL   Nitrite NEGATIVE NEGATIVE   Leukocytes, UA NEGATIVE NEGATIVE   RBC / HPF 0-5 0 - 5 RBC/hpf   WBC, UA 0-5 0 - 5 WBC/hpf   Bacteria, UA NONE SEEN NONE SEEN   Squamous Epithelial / LPF 0-5 (A) NONE SEEN   Mucus PRESENT     Comment: Performed at [facility], [address], [place], [place] [address]  Influenza panel by PCR (type A & B)     Status: None   Collection Time: 03/20/17  4:26 PM  Result Value Ref Range   Influenza A By PCR NEGATIVE NEGATIVE   Influenza B By PCR NEGATIVE NEGATIVE    Comment: (NOTE) The Xpert Xpress Flu assay is intended as an aid in the diagnosis of  influenza and should not be used as a sole basis for treatment.  This  assay is FDA approved for nasopharyngeal swab specimens only. Nasal  washings and aspirates are unacceptable for Xpert Xpress Flu testing. Performed at [facility], [address] [place], [place] [address]   Urine Drug Screen, Qualitative [facility] only)     Status: Abnormal   Collection Time: 03/20/17  4:26 PM  Result Value Ref Range   Tricyclic, Ur Screen NONE DETECTED NONE DETECTED   Amphetamines, Ur Screen NONE DETECTED NONE DETECTED   MDMA (Ecstasy)Ur Screen NONE DETECTED NONE DETECTED   Cocaine Metabolite,Ur [place] NONE DETECTED NONE DETECTED   Opiate, Ur Screen POSITIVE (A) NONE DETECTED   Phencyclidine (PCP) Ur S NONE DETECTED NONE DETECTED   Cannabinoid 50 Ng, Ur [place] NONE DETECTED NONE DETECTED   Barbiturates, Ur Screen NONE DETECTED NONE DETECTED   Benzodiazepine, Ur Scrn NONE DETECTED NONE DETECTED   Methadone Scn, Ur NONE DETECTED NONE DETECTED    Comment: (NOTE) Tricyclics + metabolites, urine    Cutoff 1000 ng/mL Amphetamines + metabolites, urine  Cutoff 1000 ng/mL MDMA (Ecstasy), urine              Cutoff 500 ng/mL Cocaine Metabolite, urine          Cutoff 300 ng/mL Opiate + metabolites, urine        Cutoff 300 ng/mL Phencyclidine (PCP), urine         Cutoff 25 ng/mL Cannabinoid, urine                 Cutoff 50 ng/mL Barbiturates + metabolites, urine  Cutoff 200 ng/mL Benzodiazepine, urine              Cutoff 200 ng/mL Methadone, urine                   Cutoff 300 ng/mL The urine drug screen provides only a preliminary, unconfirmed analytical test result and should not be used for  non-medical purposes. Clinical consideration and professional judgment should be applied to any positive drug screen result due to possible interfering substances. A more specific alternate chemical method must be used in order to obtain a confirmed analytical result. Gas chromatography / mass spectrometry (GC/MS) is the preferred confirmat ory method. Performed at [facility], [address] [place], [place] [address]  CSF cell count with differential collection tube #: 1     Status: None   Collection Time: 03/20/17  5:46 PM  Result Value Ref Range   Tube # 1    Color, CSF COLORLESS COLORLESS   Appearance, CSF CLEAR CLEAR   RBC Count, CSF 0 0 - 3 /cu mm   WBC, CSF 2 0 - 5 /cu mm   Segmented Neutrophils-CSF 0 %   Lymphs, CSF 100 %   Monocyte-Macrophage-Spinal Fluid 0 %   Eosinophils, CSF 0 %   Other Cells, CSF 0     Comment: Performed at [facility], [address] [place], [place] [address]  CSF cell count with differential     Status: Abnormal   Collection Time: 03/20/17  5:46 PM  Result Value Ref Range   Tube # 3    Color, CSF COLORLESS COLORLESS   Appearance, CSF CLEAR CLEAR   RBC Count, CSF 5 (H) 0 - 3 /cu mm   WBC, CSF 0 0 - 5 /cu mm   Segmented Neutrophils-CSF 0 %   Lymphs, CSF 0 %   Monocyte-Macrophage-Spinal Fluid 0 %   Eosinophils, CSF 0 %   Other Cells, CSF 0     Comment: Performed at [facility], [address] [place], [place] [address]  CSF culture     Status: None (Preliminary result)   Collection Time: 03/20/17  5:46 PM  Result Value Ref Range   Specimen Description      CSF Performed at [facility], [address], [place], [place] [address]    Special Requests      NONE Performed at [address] [place], [place] [address]    Gram Stain      CYTOSPIN SMEAR RARE WBC SEEN RARE RBC SEEN NO ORGANISMS SEEN    Culture      NO GROWTH < 12 HOURS Performed at [facility], [place] [address] [place], [place] [address]    Report Status PENDING   Protein and glucose, CSF     Status: None   Collection Time: 03/20/17  5:46 PM  Result Value Ref Range   Glucose, CSF 62 40 - 70 mg/dL   Total  Protein, CSF 22 15 - 45 mg/dL    Comment: Performed at [facility], [address] [place], [place] [address]  Lactic acid, plasma     Status: Abnormal   Collection Time: 03/20/17  5:49 PM  Result Value Ref Range   Lactic Acid, Venous 2.6 (HH) 0.5 - 1.9 mmol/L    Comment: CRITICAL RESULT CALLED TO, READ BACK BY AND VERIFIED WITH ALICIA GRANGER AT 8182 ON 03/20/2017 JJB Performed at [facility], [address], [place], [place] [address]   MRSA PCR Screening     Status: None   Collection Time: 03/20/17  8:50 PM  Result Value Ref Range   MRSA by PCR NEGATIVE NEGATIVE    Comment:        The GeneXpert MRSA Assay (FDA approved for NASAL specimens only), is one component of a comprehensive MRSA colonization surveillance program. It is not intended to diagnose MRSA infection nor to guide or monitor treatment for MRSA infections. Performed at [facility], [address] [place], [place] [address]   Glucose, capillary     Status: None   Collection Time: 03/20/17  8:55 PM  Result Value Ref Range   Glucose-Capillary 79 65 - 99 mg/dL  Lactic acid, plasma     Status: Abnormal   Collection Time: 03/21/17 12:06 AM  Result Value Ref Range   Lactic Acid, Venous 4.9 (HH) 0.5 - 1.9 mmol/L    Comment: CRITICAL RESULT CALLED TO, READ BACK BY AND VERIFIED WITH MISTY CAUSEY ON 03/21/17 AT 0112 JAG Performed at [facility], [address] [place], [place] [address]   Lactic acid, plasma     Status: Abnormal   Collection Time: 03/21/17  3:02 AM  Result Value Ref Range   Lactic Acid, Venous 4.0 (HH) 0.5 - 1.9 mmol/L    Comment: CRITICAL RESULT CALLED TO, READ BACK BY AND VERIFIED WITH BARBARA THAO AT 0350 ON 03/21/17  [place]. Performed at [facility], [address] [place], [place] [address]   Basic metabolic panel     Status: Abnormal   Collection Time: 03/21/17  3:04 AM  Result Value Ref Range   Sodium 132 (L) 135 - 145 mmol/L   Potassium 4.0 3.5 - 5.1 mmol/L   Chloride 103 101 - 111 mmol/L   CO2 18 (L) 22 - 32 mmol/L   Glucose, Bld 150 (H) 65 - 99 mg/dL   BUN 25 (H) 6 - 20 mg/dL   Creatinine, Ser 2.02 (H) 0.61 - 1.24 mg/dL   Calcium 7.1 (L) 8.9 - 10.3 mg/dL   GFR calc non Af Amer 38 (L) >60 mL/min   GFR calc Af Amer 44 (L) >60 mL/min    Comment: (NOTE) The eGFR has been calculated using the CKD EPI equation. This calculation has not been validated in all clinical situations. eGFR's persistently <60 mL/min signify possible Chronic Kidney Disease.    Anion gap 11 5 - 15    Comment: Performed at [facility], [address] [place], [place] [address]  CBC     Status: Abnormal   Collection Time: 03/21/17  3:04 AM  Result Value Ref Range   WBC 16.9 (H) 3.8 - 10.6 K/uL   RBC 4.16 (L) 4.40 - 5.90 MIL/uL   Hemoglobin 11.6 (L) 13.0 - 18.0 g/dL   HCT 35.4 (L) 40.0 - 52.0 %   MCV 85.0 80.0 - 100.0 fL   MCH 28.0 26.0 - 34.0 pg   MCHC 32.9 32.0 - 36.0 g/dL   RDW 14.9 (H) 11.5 - 14.5 %   Platelets 143 (L) 150 - 440 K/uL    Comment: Performed at [facility], [address] [place], [place] [address]  Procalcitonin     Status: None   Collection Time: 03/21/17  3:04 AM  Result Value Ref Range   Procalcitonin >150.00 ng/mL    Comment:        Interpretation: PCT >= 10 ng/mL: Important systemic inflammatory response, almost exclusively due to severe bacterial sepsis or septic shock. RESULT REPEATED AND VERIFIED (NOTE)       Sepsis PCT Algorithm           Lower Respiratory Tract                                      Infection PCT Algorithm    ----------------------------     ----------------------------         PCT < 0.25 ng/mL                PCT < 0.10 ng/mL          Strongly encourage  Strongly discourage   discontinuation of antibiotics    initiation of antibiotics    ----------------------------     -----------------------------       PCT 0.25 - 0.50 ng/mL            PCT 0.10 - 0.25 ng/mL               OR       >80% decrease in PCT            Discourage initiation of                                            antibiotics      Encourage discontinuation           of antibiotics    ----------------------------     -----------------------------         PCT >= 0.50 ng/mL              PCT 0 .26 - 0.50 ng/mL               AND       <80% decrease in PCT             Encourage initiation of                                             antibiotics       Encourage continuation           of antibiotics    ----------------------------     -----------------------------        PCT >= 0.50 ng/mL                  PCT > 0.50 ng/mL               AND         increase in PCT                  Strongly encourage                                      initiation of antibiotics    Strongly encourage escalation           of antibiotics                                     -----------------------------                                           PCT <= 0.25 ng/mL                                                 OR                                        >  80% decrease in PCT                                     Discontinue / Do not initiate                                             antibiotics Performed at [facility], [address] [place], [place] [address]   Blood gas, arterial     Status: Abnormal   Collection Time: 03/21/17  6:44 AM  Result Value Ref Range   pH, Arterial 7.44 7.350 - 7.450   pCO2 arterial 27 (L) 32.0 - 48.0 mmHg   pO2, Arterial 69 (L) 83.0 - 108.0 mmHg   Bicarbonate 18.3 (L) 20.0 - 28.0 mmol/L   Acid-base deficit 4.7 (H) 0.0 - 2.0 mmol/L   O2 Saturation 94.2 %   Patient temperature 37.0    Collection site LEFT RADIAL     Sample type ARTERIAL DRAW    Allens test (pass/fail) PASS PASS    Comment: Performed at [facility], [address] [place], [place] [address]  Glucose, capillary     Status: Abnormal   Collection Time: 03/21/17  7:29 AM  Result Value Ref Range   Glucose-Capillary 152 (H) 65 - 99 mg/dL  Lactic acid, plasma     Status: None   Collection Time: 03/21/17 12:09 PM  Result Value Ref Range   Lactic Acid, Venous 1.7 0.5 - 1.9 mmol/L    Comment: Performed at [facility], [address] [place], [place] [address]   No components found for: ESR, C REACTIVE PROTEIN MICRO: Recent Results (from the past 720 hour(s))  Blood Culture (routine x 2)     Status: None (Preliminary result)   Collection Time: 03/20/17  3:52 PM  Result Value Ref Range Status   Specimen Description   Final    BLOOD LEFT ANTECUBITAL Performed at [facility], [address] [place], [place] [address]    Special Requests   Final    BOTTLES DRAWN AEROBIC AND ANAEROBIC Blood Culture adequate volume Performed at [facility], [address] [place], [place] [address]    Culture  Setup Time   Final    Organism ID to follow GRAM NEGATIVE RODS IN BOTH AEROBIC AND ANAEROBIC BOTTLES CRITICAL RESULT CALLED TO, READ BACK BY AND VERIFIED WITH: Wilcox 03/21/17 ALV GRAM STAIN REVIEWED-AGREE WITH RESULT Performed at [facility], [place] [address] [place], [place] [address]    Culture GRAM NEGATIVE RODS  Final   Report Status PENDING  Incomplete  Blood Culture (routine x 2)     Status: None (Preliminary result)   Collection Time: 03/20/17  3:52 PM  Result Value Ref [place] Status   Specimen Description   Final    BLOOD RIGHT ANTECUBITAL Performed at [facility] [address], [place], [place] [address]    Special Requests   Final    BOTTLES DRAWN AEROBIC AND ANAEROBIC Blood Culture adequate volume Performed at [facility], [address] [place], [place] [address]    Culture  Setup Time   Final    Organism ID to follow GRAM NEGATIVE RODS IN BOTH AEROBIC AND ANAEROBIC BOTTLES CRITICAL RESULT CALLED TO, READ BACK BY  AND VERIFIED WITH: DAVIID BESANTI AT 0407 03/21/17 ALV GRAM STAIN REVIEWED-AGREE WITH RESULT Performed at [facility], [place] [address] [place], [place] [address]    Culture GRAM NEGATIVE RODS  Final   Report Status PENDING  Incomplete  Blood Culture ID Panel (Reflexed)     Status: Abnormal   Collection Time: 03/20/17  3:52 PM  Result Value Ref [place] Status   Enterococcus species NOT DETECTED NOT DETECTED Final   Listeria monocytogenes NOT DETECTED NOT DETECTED Final   Staphylococcus species NOT DETECTED NOT DETECTED Final   Staphylococcus aureus NOT DETECTED NOT DETECTED Final   Streptococcus species NOT DETECTED NOT DETECTED Final   Streptococcus agalactiae NOT DETECTED NOT DETECTED Final   Streptococcus pneumoniae NOT DETECTED NOT DETECTED Final   Streptococcus pyogenes NOT DETECTED NOT DETECTED Final   Acinetobacter baumannii NOT DETECTED NOT DETECTED Final   Enterobacteriaceae species DETECTED (A) NOT DETECTED Final    Comment: CRITICAL RESULT CALLED TO, READ BACK BY AND VERIFIED WITH: Marvis Saefong BESANTI AT 0407 03/21/17 ALV    Enterobacter cloacae complex NOT DETECTED NOT DETECTED Final   Escherichia coli NOT DETECTED NOT DETECTED Final   Klebsiella oxytoca DETECTED (A) NOT DETECTED Final    Comment: CRITICAL RESULT CALLED TO, READ BACK BY AND VERIFIED WITH: Keeley Sussman BESANTI AT 0407 03/21/17 ALV    Klebsiella pneumoniae DETECTED (A) NOT DETECTED Final    Comment: CRITICAL RESULT CALLED TO, READ BACK BY AND VERIFIED WITH: Marvelous Woolford BESANTI AT 0407 03/21/17 ALV    Proteus species NOT DETECTED NOT DETECTED Final   Serratia marcescens NOT DETECTED NOT DETECTED Final   Carbapenem resistance NOT DETECTED NOT DETECTED Final   Haemophilus influenzae NOT DETECTED NOT DETECTED Final   Neisseria meningitidis NOT  DETECTED NOT DETECTED Final   Pseudomonas aeruginosa NOT DETECTED NOT DETECTED Final   Candida albicans NOT DETECTED NOT DETECTED Final   Candida glabrata NOT DETECTED NOT DETECTED Final   Candida krusei NOT DETECTED NOT DETECTED Final   Candida parapsilosis NOT DETECTED NOT DETECTED Final   Candida tropicalis NOT DETECTED NOT DETECTED Final    Comment: Performed at [facility], [address] [place], [place] [address]  CSF culture     Status: None (Preliminary result)   Collection Time: 03/20/17  5:46 PM  Result Value Ref [place] Status   Specimen Description   Final    CSF Performed at [facility], [address], [place], [place] [address]    Special Requests   Final    NONE Performed at [facility], [address] [place], [place] [address]    Gram Stain   Final    CYTOSPIN SMEAR RARE WBC SEEN RARE RBC SEEN NO ORGANISMS SEEN    Culture   Final    NO GROWTH < 12 HOURS Performed at [facility], [address], [place], [place] [address]    Report Status PENDING  Incomplete  MRSA PCR Screening     Status: None   Collection Time: 03/20/17  8:50 PM  Result Value Ref [place] Status   MRSA by PCR NEGATIVE NEGATIVE Final    Comment:        The GeneXpert MRSA Assay (FDA approved for NASAL specimens only), is one component of a comprehensive MRSA colonization surveillance program. It is not intended to diagnose MRSA infection nor to guide or monitor treatment for MRSA infections. Performed at [facility], [address], [place], [place] [address]  IMAGING: Ct Head Wo Contrast  Result Date: 03/20/2017 CLINICAL DATA:  Unresponsive, history of pancreatic carcinoma EXAM: CT HEAD WITHOUT CONTRAST TECHNIQUE: Contiguous axial images were obtained from the base of the skull through the vertex without intravenous contrast. COMPARISON:  None. FINDINGS: Brain: No evidence of acute infarction, hemorrhage, hydrocephalus, extra-axial  collection or mass lesion/mass effect. Vascular: No hyperdense vessel or unexpected calcification. Skull: Normal. Negative for fracture or focal lesion. Sinuses/Orbits: No acute finding. Other: None. IMPRESSION: No acute intracranial abnormality noted. Electronically Signed   By: Inez Catalina M.D.   On: 03/20/2017 16:27   Dg Chest Port 1 View  Result Date: 03/21/2017 CLINICAL DATA:  Acute respiratory failure. EXAM: PORTABLE CHEST 1 VIEW COMPARISON:  03/20/2017 FINDINGS: Heart size is normal. Mediastinal shadows are normal. Patient has taken a poor inspiration. Allowing for that, the lungs are probably clear. There could be mild basilar atelectasis. No dense consolidation or lobar collapse. No effusions. IMPRESSION: Poor inspiration.  Allowing for that, no active disease is suspected Electronically Signed   By: Nelson Chimes M.D.   On: 03/21/2017 06:45   Dg Chest Port 1 View  Result Date: 03/20/2017 CLINICAL DATA:  Hypoxia, fever and altered mental status. EXAM: PORTABLE CHEST 1 VIEW COMPARISON:  02/09/2018 FINDINGS: Poor inspiratory effort. Heart size is normal. Allowing for the poor inspiration, the lungs are probably clear. No edema or effusions. No acute bone finding. IMPRESSION: Poor inspiration.  No active disease suspected allowing for that. Electronically Signed   By: Nelson Chimes M.D.   On: 03/20/2017 16:14   US Abdomen Limited Ruq  Result Date: 03/21/2017 CLINICAL DATA:  Status post Whipple procedure for pancreatic cancer. EXAM: ULTRASOUND ABDOMEN LIMITED RIGHT UPPER QUADRANT COMPARISON:  None. FINDINGS: Gallbladder: Status post cholecystectomy. Common bile duct: Diameter: 9 mm which is mildly dilated most consistent with post cholecystectomy status. Liver: Increased echogenicity of hepatic parenchyma is noted. Intrahepatic biliary dilatation is noted. Multiple low densities are noted throughout hepatic parenchyma consistent with metastatic disease, the largest measuring 4.4 cm in right hepatic  lobe. Portal vein is patent on color Doppler imaging with normal direction of blood flow towards the liver. IMPRESSION: Status post cholecystectomy. Mild intrahepatic and extrahepatic biliary dilatation is noted which may be due to post cholecystectomy status, but distal common bile duct obstruction cannot be excluded, and correlation with liver function tests is noted. Multiple hepatic masses are noted consistent with metastatic disease, the largest measuring 4.4 cm in right hepatic lobe. Electronically Signed   By: Marijo Conception, M.D.   On: 03/21/2017 09:05    Assessment:   William Lara is a 47 y.o. male with pancreatitic cancer s/p Whipples procedure admitted with several days of fever, confusion, body aches and found to have sepsis with BCX + Klebsiella Pna and Oxytoca and LUQ USS showing multiple hepatic masses concerning for malignancy. HE had seen onc in the fall and had neg CT scan and is due for repeat imaging .Clinically improving.   Recommendations Cont cefepime but add flagyl .  DC vanco Check MRI Thank you very much for allowing me to participate in the care of this patient. Please call with questions.   Cheral Marker. Ola Spurr, MD

## 2017-03-22 DIAGNOSIS — R6521 Severe sepsis with septic shock: Secondary | ICD-10-CM

## 2017-03-22 DIAGNOSIS — R7881 Bacteremia: Secondary | ICD-10-CM

## 2017-03-22 LAB — CBC WITH DIFFERENTIAL/PLATELET
Basophils Absolute: 0.1 10*3/uL (ref 0–0.1)
Basophils Relative: 1 %
EOS ABS: 0 10*3/uL (ref 0–0.7)
Eosinophils Relative: 0 %
HCT: 31.3 % — ABNORMAL LOW (ref 40.0–52.0)
HEMOGLOBIN: 10.4 g/dL — AB (ref 13.0–18.0)
LYMPHS ABS: 0.4 10*3/uL — AB (ref 1.0–3.6)
Lymphocytes Relative: 3 %
MCH: 27.9 pg (ref 26.0–34.0)
MCHC: 33 g/dL (ref 32.0–36.0)
MCV: 84.4 fL (ref 80.0–100.0)
MONO ABS: 0.7 10*3/uL (ref 0.2–1.0)
MONOS PCT: 6 %
Neutro Abs: 10.5 10*3/uL — ABNORMAL HIGH (ref 1.4–6.5)
Neutrophils Relative %: 90 %
Platelets: 128 10*3/uL — ABNORMAL LOW (ref 150–440)
RBC: 3.71 MIL/uL — ABNORMAL LOW (ref 4.40–5.90)
RDW: 15 % — AB (ref 11.5–14.5)
WBC: 11.7 10*3/uL — ABNORMAL HIGH (ref 3.8–10.6)

## 2017-03-22 LAB — COMPREHENSIVE METABOLIC PANEL
ALT: 132 U/L — AB (ref 17–63)
AST: 169 U/L — AB (ref 15–41)
Albumin: 1.9 g/dL — ABNORMAL LOW (ref 3.5–5.0)
Alkaline Phosphatase: 121 U/L (ref 38–126)
Anion gap: 6 (ref 5–15)
BUN: 38 mg/dL — AB (ref 6–20)
CALCIUM: 7.4 mg/dL — AB (ref 8.9–10.3)
CHLORIDE: 105 mmol/L (ref 101–111)
CO2: 21 mmol/L — AB (ref 22–32)
CREATININE: 2.27 mg/dL — AB (ref 0.61–1.24)
GFR calc Af Amer: 38 mL/min — ABNORMAL LOW (ref >60)
GFR calc non Af Amer: 33 mL/min — ABNORMAL LOW (ref >60)
GLUCOSE: 112 mg/dL — AB (ref 65–99)
Potassium: 3.5 mmol/L (ref 3.5–5.1)
SODIUM: 132 mmol/L — AB (ref 135–145)
Total Bilirubin: 2.3 mg/dL — ABNORMAL HIGH (ref 0.3–1.2)
Total Protein: 4.8 g/dL — ABNORMAL LOW (ref 6.5–8.1)

## 2017-03-22 LAB — URINE CULTURE: Culture: NO GROWTH

## 2017-03-22 LAB — PROCALCITONIN

## 2017-03-22 LAB — GLUCOSE, CAPILLARY: GLUCOSE-CAPILLARY: 115 mg/dL — AB (ref 65–99)

## 2017-03-22 LAB — HIV ANTIBODY (ROUTINE TESTING W REFLEX): HIV Screen 4th Generation wRfx: NONREACTIVE

## 2017-03-22 MED ORDER — SODIUM CHLORIDE 0.9 % IV BOLUS (SEPSIS)
1000.0000 mL | Freq: Once | INTRAVENOUS | Status: AC
  Administered 2017-03-22: 1000 mL via INTRAVENOUS

## 2017-03-22 MED ORDER — OXYCODONE-ACETAMINOPHEN 5-325 MG PO TABS: 1.0000 | ORAL_TABLET | ORAL | Status: DC | PRN

## 2017-03-22 MED ORDER — MORPHINE SULFATE (PF) 2 MG/ML IV SOLN
1.0000 mg | INTRAVENOUS | Status: DC | PRN
Start: 2017-03-22 — End: 2017-03-22
  Administered 2017-03-22: 1 mg via INTRAVENOUS
  Filled 2017-03-22: qty 1

## 2017-03-22 NOTE — Progress Notes (Signed)
[place] [facility] [place] NAME: William Lara    MR#:  [id number]  DATE OF BIRTH:  February 03, 1971  SUBJECTIVE:  CHIEF COMPLAINT:   Chief Complaint  Patient presents with  . Altered Mental Status    Brought with altered mental status and fever, also had some complaint of headache.  More alert today, still some lethargic, having low-grade fever now.  LP did not show any evidence of infection. Multiple liver mass per ultrasound. MRI confirmed liver masses likely malignancy.  REVIEW OF SYSTEMS:  CONSTITUTIONAL: No fever, fatigue or weakness.  EYES: No blurred or double vision.  EARS, NOSE, AND THROAT: No tinnitus or ear pain.  RESPIRATORY: No cough, shortness of breath, wheezing or hemoptysis.  CARDIOVASCULAR: No chest pain, orthopnea, edema.  GASTROINTESTINAL: No nausea, vomiting, diarrhea or abdominal pain.  GENITOURINARY: No dysuria, hematuria.  ENDOCRINE: No polyuria, nocturia,  HEMATOLOGY: No anemia, easy bruising or bleeding SKIN: No rash or lesion. MUSCULOSKELETAL: No joint pain or arthritis.   NEUROLOGIC: No tingling, numbness, weakness.  PSYCHIATRY: No anxiety or depression.   ROS  DRUG ALLERGIES:   Allergies  Allergen Reactions  . Iodinated Diagnostic Agents Anaphylaxis, Shortness Of Breath and Other (See Comments)    Patient had a breakthrough reaction to IV CT contrast despite a 13 hour prep.  The patient should not have iodinated CT contrast ever again due to a breakthrough reaction.  . Shellfish Allergy Other (See Comments)    Shellfish-Migraine. Does fine with CT scans.    VITALS:  Blood pressure 115/71, pulse (!) 110, temperature 98.5 F (36.9 C), temperature source Oral, resp. rate (!) 23, height 6\' 4"  (1.93 m), weight 102.3 kg (225 lb 8.5 oz), SpO2 95 %.  PHYSICAL EXAMINATION:  GENERAL:  47 y.o.-year-old patient lying in the bed with no acute distress. Appears very weak. EYES: Pupils equal, round, reactive to light and  accommodation. No scleral icterus. Extraocular muscles intact.  HEENT: Head atraumatic, normocephalic. Oropharynx and nasopharynx clear.  NECK:  Supple, no jugular venous distention. No thyroid enlargement, no tenderness.  LUNGS: Normal breath sounds bilaterally, no wheezing, rales,rhonchi or crepitation. No use of accessory muscles of respiration.  CARDIOVASCULAR: S1, S2 normal. No murmurs, rubs, or gallops.  ABDOMEN: Soft, nontender, nondistended. Bowel sounds present. No organomegaly or mass.  EXTREMITIES: No pedal edema, cyanosis, or clubbing.  NEUROLOGIC: Cranial nerves II through XII are intact. Muscle strength 4/5 in all extremities. Sensation intact. Gait not checked.  PSYCHIATRIC: The patient is alert and oriented x 3.  SKIN: No obvious rash, lesion, or ulcer.   Physical Exam LABORATORY PANEL:   CBC Recent Labs  Lab 03/22/17 0531  WBC 11.7*  HGB 10.4*  HCT 31.3*  PLT 128*   ------------------------------------------------------------------------------------------------------------------  Chemistries  Recent Labs  Lab 03/22/17 0531  NA 132*  K 3.5  CL 105  CO2 21*  GLUCOSE 112*  BUN 38*  CREATININE 2.27*  CALCIUM 7.4*  AST 169*  ALT 132*  ALKPHOS 121  BILITOT 2.3*   ------------------------------------------------------------------------------------------------------------------  Cardiac Enzymes Recent Labs  Lab 03/20/17 1550  TROPONINI <0.03   ------------------------------------------------------------------------------------------------------------------  RADIOLOGY:  Ct Head Wo Contrast  Result Date: 03/20/2017 CLINICAL DATA:  Unresponsive, history of pancreatic carcinoma EXAM: CT HEAD WITHOUT CONTRAST TECHNIQUE: Contiguous axial images were obtained from the base of the skull through the vertex without intravenous contrast. COMPARISON:  None. FINDINGS: Brain: No evidence of acute infarction, hemorrhage, hydrocephalus, extra-axial collection or mass  lesion/mass effect. Vascular: No hyperdense  vessel or unexpected calcification. Skull: Normal. Negative for fracture or focal lesion. Sinuses/Orbits: No acute finding. Other: None. IMPRESSION: No acute intracranial abnormality noted. Electronically Signed   By: Inez Catalina M.D.   On: 03/20/2017 16:27   Mr Abdomen W EU Contrast  Result Date: 03/22/2017 CLINICAL DATA:  Inpatient. History of Whipple procedure for pancreatic cancer 2 years prior. Multiple liver masses at sonography. EXAM: MRI ABDOMEN WITHOUT AND WITH CONTRAST TECHNIQUE: Multiplanar multisequence MR imaging of the abdomen was performed both before and after the administration of intravenous contrast. CONTRAST:  54mL MULTIHANCE GADOBENATE DIMEGLUMINE 529 MG/ML IV SOLN COMPARISON:  03/21/2017 abdominal sonogram. FINDINGS: Lower chest: Mild elevation of the right hemidiaphragm. Trace dependent pleural effusions and mild dependent lower lobe atelectasis at both lung bases. Mildly enlarged 1.1 cm lower paraesophageal node (series 19/image 42). Hepatobiliary: No definite liver surface irregularity. No hepatic steatosis. There are multiple (at least 5) similar poorly marginated masses scattered in the liver, each demonstrating thick peripheral hyperenhancement and central fluid signal intensity without restricted diffusion, largest measuring 5.6 x 3.6 cm in segment 8 of the right liver lobe (series 19/ image 37), 4.8 x 3.6 cm in segment 8 of the right liver lobe (series 19/ image 57) and 2.0 x 1.5 cm in segment 5 of the right liver lobe (series 19/image 70). Cholecystectomy. There is moderate diffuse intrahepatic biliary ductal dilatation. Patient is status post choledochojejunostomy with common hepatic duct diameter 12 mm. No evidence of choledocholithiasis. Pancreas: Status post resection of the pancreatic head and pancreaticojejunostomy. Top-normal caliber main pancreatic duct in the remnant pancreas (2 mm diameter). No discrete pancreatic mass. Spleen:  Normal size spleen. No splenic mass. Multiple small subcapsular peripheral foci of nonenhancement in the spleen compatible with small splenic infarcts. Adrenals/Urinary Tract: Normal adrenals. No hydronephrosis. Normal kidneys with no renal mass. Stomach/Bowel: Status post distal gastrectomy and gastrojejunostomy. No acute abnormality in the nondistended remnant stomach. Visualized small and large bowel is normal caliber, with no bowel wall thickening. Vascular/Lymphatic: Normal caliber abdominal aorta. There is an infiltrative retroperitoneal soft tissue mass centered at the root of the SMA and celiac trunk measuring approximately 5.8 x 3.9 x 5.0 cm (series 19/ image 73) and extending to the portosplenic venous confluence. There is extrinsic narrowing of the celiac trunk, at least high-grade (series 14/image 60). There is extrinsic narrowing of the SMA, which appears high-grade (series 14/ image 76). There is high-grade narrowing versus occlusion of the portosplenic venous confluence by the infiltrative mass, with a nonenhancing filling defect in the main portal vein compatible with bland thrombus (series 20/ image 67). There is at least high-grade narrowing of the left renal vein near the junction of the IVC by the mass (series 18/ image 57). Patent right renal vein. There are multiple enlarged upper retroperitoneal nodes in the gastrohepatic ligament, porta hepatis, aortocaval and para celiac chains. For example a 1.3 cm gastrohepatic ligament node (series 14/ image 62), a 1.7 cm porta hepatis node (series 14/ image 66) and a 1.1 cm aortocaval node (series 14/ image 77). Other: Small volume abdominal ascites. No focal fluid collection. Musculoskeletal: No aggressive appearing focal osseous lesions. IMPRESSION: 1. Infiltrative retroperitoneal soft tissue mass centered at the roots of the SMA and celiac trunk, most compatible with locally recurrent pancreatic malignancy. 2. Extrinsic narrowing (at least high-grade)  of the proximal SMA, celiac trunk, portosplenic venous confluence and left renal vein by the retroperitoneal mass. CT angiography of the abdomen and pelvis with IV contrast may be considered  for further characterization of these suspected malignant vascular stenoses, as clinically warranted. 3. Multiple (at least 5) scattered similar poorly-marginated peripherally enhancing centrally cystic liver masses. Metastases are favored, although bilomas/early hepatic abscesses are on the differential especially given the significant vascular new stenoses described above. 4. Extensive upper retroperitoneal and mild lower mediastinal adenopathy, suspicious for nodal metastatic disease . 5. Small volume ascites. 6. Trace dependent bilateral pleural effusions. 7. Moderate diffuse intrahepatic biliary ductal dilatation. Status post choledochojejunostomy with common hepatic duct diameter 12 mm. No appreciable obstructing biliary stone or mass. 8. Multiple small splenic infarcts. Electronically Signed   By: Ilona Sorrel M.D.   On: 03/22/2017 08:14   Dg Chest Port 1 View  Result Date: 03/21/2017 CLINICAL DATA:  Acute respiratory failure. EXAM: PORTABLE CHEST 1 VIEW COMPARISON:  03/20/2017 FINDINGS: Heart size is normal. Mediastinal shadows are normal. Patient has taken a poor inspiration. Allowing for that, the lungs are probably clear. There could be mild basilar atelectasis. No dense consolidation or lobar collapse. No effusions. IMPRESSION: Poor inspiration.  Allowing for that, no active disease is suspected Electronically Signed   By: Nelson Chimes M.D.   On: 03/21/2017 06:45   Dg Chest Port 1 View  Result Date: 03/20/2017 CLINICAL DATA:  Hypoxia, fever and altered mental status. EXAM: PORTABLE CHEST 1 VIEW COMPARISON:  02/09/2018 FINDINGS: Poor inspiratory effort. Heart size is normal. Allowing for the poor inspiration, the lungs are probably clear. No edema or effusions. No acute bone finding. IMPRESSION: Poor  inspiration.  No active disease suspected allowing for that. Electronically Signed   By: Nelson Chimes M.D.   On: 03/20/2017 16:14   US Abdomen Limited Ruq  Result Date: 03/21/2017 CLINICAL DATA:  Status post Whipple procedure for pancreatic cancer. EXAM: ULTRASOUND ABDOMEN LIMITED RIGHT UPPER QUADRANT COMPARISON:  None. FINDINGS: Gallbladder: Status post cholecystectomy. Common bile duct: Diameter: 9 mm which is mildly dilated most consistent with post cholecystectomy status. Liver: Increased echogenicity of hepatic parenchyma is noted. Intrahepatic biliary dilatation is noted. Multiple low densities are noted throughout hepatic parenchyma consistent with metastatic disease, the largest measuring 4.4 cm in right hepatic lobe. Portal vein is patent on color Doppler imaging with normal direction of blood flow towards the liver. IMPRESSION: Status post cholecystectomy. Mild intrahepatic and extrahepatic biliary dilatation is noted which may be due to post cholecystectomy status, but distal common bile duct obstruction cannot be excluded, and correlation with liver function tests is noted. Multiple hepatic masses are noted consistent with metastatic disease, the largest measuring 4.4 cm in right hepatic lobe. Electronically Signed   By: Marijo Conception, M.D.   On: 03/21/2017 09:05    ASSESSMENT AND PLAN:   Active Problems:   Septic shock ([place])  47 year old male patient with history of pancreatic cancer status post Whipple procedure comes in with high fever, altered mental status and sepsis with elevated lactic acid.  1.  Septic shock - klebsiella bacteremia.  on admission with elevated lactic acid, tachycardia, mild acute kidney injury with mild  metabolic acidosis, patient bicarb is around 20.    on IV fluids, follow blood cultures, continue empiric antibiotics with vancomycin, Zosyn.     Also has hyponatremia: Continue IV fluids.  have some liver masses on [place] abd- MRI confirmed liver malignancy.    #2. altered mental status with a headache: High fever:  Improved now,. LP is negative for infection cT head negative.  Improved.  # Severe sinus tachycardia due to dehydration: Improved with IV hydration,  heart rate decreased from 180- to100.  #4 history of pancreatic cancer status post Whipple procedure, patient is on Creon supplements at home.  Follow[facility] at [facility] for surveillance.   Discussed with patient's mother.  #5 Ac renal failure    Due to sepsis, IV Fluids.  #6 Liver mass   Hx of Cancer- treated and had surgery at [facility].    Accepted at [facility] for transfer, awaited bed.  All the records are reviewed and case discussed with Care Management/Social Workerr. Management plans discussed with the patient, family and they are in agreement.  CODE STATUS: Full.  TOTAL TIME TAKING CARE OF THIS PATIENT: 35 minutes.   POSSIBLE D/C IN 1-2 DAYS, DEPENDING ON CLINICAL CONDITION.   Vaughan Basta M.D on 03/22/2017   Between 7am to 6pm - Pager - [phone]  After 6pm go to [email or web address] - password [email or web address] [place] [facility]  Office  [phone]  CC: Primary care physician; Pauline Good Thornell Mule, MD  Note: This dictation was prepared with Dragon dictation along with smaller phrase technology. Any transcriptional errors that result from this process are unintentional.

## 2017-03-22 NOTE — Progress Notes (Signed)
Report given to Colletta Maryland, RN at [facility], RN with [employer] ground transport. Pt transferred to [facility] stretcher without incident.

## 2017-03-22 NOTE — Progress Notes (Signed)
Pharmacy Antibiotic Note  William Lara is a 47 y.o. male admitted on 03/20/2017 with bacteremia.  Pharmacy has been consulted for cefazolin dosing. Patient with Klebsiella oxytoca and Klebsiella pneumoniae on BCID. Patient with extensive healthcare history including pancreatic cancer s/p Whipple procedure. Plan is to transfer patient to [facility], MRI shows recurrent malignant cancer.    Plan: Per ID will continue broad spectrum coverage in setting of likely polymicrobial infection.   Will continue cefepime 2g IV Q8hr. Will continue to monitor creatinine clearance; patient is borderline for renal adjustment - will continue aggressive dosing during this acute period.   Will initiate metronidazole 500mg  IV Q8hr.    Height: 6\' 4"  (193 cm) Weight: 225 lb 8.5 oz (102.3 kg) IBW/kg (Calculated) : 86.8  Temp (24hrs), Avg:98.1 F (36.7 C), Min:97.7 F (36.5 C), Max:99 F (37.2 C)  Recent Labs  Lab 03/20/17 1550 03/20/17 1552 03/20/17 1749 03/21/17 0006 03/21/17 0302 03/21/17 0304 03/21/17 1209 03/22/17 0531  WBC 4.6  --   --   --   --  16.9*  --  11.7*  CREATININE 1.37*  --   --   --   --  2.02*  --  2.27*  LATICACIDVEN  --  3.5* 2.6* 4.9* 4.0*  --  1.7  --     Estimated Creatinine Clearance: 50.5 mL/min (A) (by C-G formula based on SCr of 2.27 mg/dL (H)).    Allergies  Allergen Reactions  . Iodinated Diagnostic Agents Anaphylaxis, Shortness Of Breath and Other (See Comments)    Patient had a breakthrough reaction to IV CT contrast despite a 13 hour prep.  The patient should not have iodinated CT contrast ever again due to a breakthrough reaction.  . Shellfish Allergy Other (See Comments)    Shellfish-Migraine. Does fine with CT scans.    Antimicrobials this admission: Zosyn 1/2 X 1  Vancomycin 1/2 >> 1/3 Cefepime 1/3 >>  Metronidazole 1/3 >>   Dose adjustments this admission: 1/3 readjusted to Q8hr dosing.   Microbiology results: 1/2 BCx: Klebsiella oxytoca and  Klebsiella pneumoniae  1/2 UCx: sent  1/2 CSF: no growth < 12 hours  1/2 MRSA PCR: negative  1/3 PCT: > 150   Thank you for allowing pharmacy to be a part of this patient's care.  Dalilah Curlin L 03/22/2017 9:34 AM

## 2017-03-22 NOTE — Progress Notes (Signed)
CHIEF COMPLAINT:   Chief Complaint  Patient presents with  . Altered Mental Status    Subjective  Follow up sepsis LA improved to 1.7 BP low but improved Lethargic but arousable Admitted for klebsiella bacteremia/sepsis [place] bad shows liver masses MRI abd confirms soft tissue masses in liver and surrounding blood vessels     Objective   Examination:  General exam: ill appearing Respiratory system: Clear to auscultation. Respiratory effort normal. HEENT: [place]/AT, PERRLA, no thrush, no stridor. Cardiovascular system: S1 & S2 heard, RRR. No JVD, murmurs, rubs, gallops or clicks. No pedal edema. Gastrointestinal system: Abdomen is nondistended, soft and nontender. No organomegaly or masses felt. Normal bowel sounds heard. Central nervous system: Alert and oriented. No focal neurological deficits. Extremities: Symmetric 5 x 5 power. Skin: No rashes, lesions or ulcers Psychiatry: Judgement and insight appear normal. Mood & affect appropriate.   VITALS:  height is 6\' 4"  (1.93 m) and weight is 225 lb 8.5 oz (102.3 kg). His axillary temperature is 98 F (36.7 C). His blood pressure is 123/80 and his pulse is 103 (abnormal). His respiration is 19 and oxygen saturation is 96%.   I personally reviewed Labs under Results section.  Radiology Reports Ct Head Wo Contrast  Result Date: 03/20/2017 CLINICAL DATA:  Unresponsive, history of pancreatic carcinoma EXAM: CT HEAD WITHOUT CONTRAST TECHNIQUE: Contiguous axial images were obtained from the base of the skull through the vertex without intravenous contrast. COMPARISON:  None. FINDINGS: Brain: No evidence of acute infarction, hemorrhage, hydrocephalus, extra-axial collection or mass lesion/mass effect. Vascular: No hyperdense vessel or unexpected calcification. Skull: Normal. Negative for fracture or focal lesion. Sinuses/Orbits: No acute finding. Other: None. IMPRESSION: No acute intracranial abnormality noted. Electronically Signed   By:  Inez Catalina M.D.   On: 03/20/2017 16:27   Mr Abdomen W WS Contrast  Result Date: 03/22/2017 CLINICAL DATA:  Inpatient. History of Whipple procedure for pancreatic cancer 2 years prior. Multiple liver masses at sonography. EXAM: MRI ABDOMEN WITHOUT AND WITH CONTRAST TECHNIQUE: Multiplanar multisequence MR imaging of the abdomen was performed both before and after the administration of intravenous contrast. CONTRAST:  55mL MULTIHANCE GADOBENATE DIMEGLUMINE 529 MG/ML IV SOLN COMPARISON:  03/21/2017 abdominal sonogram. FINDINGS: Lower chest: Mild elevation of the right hemidiaphragm. Trace dependent pleural effusions and mild dependent lower lobe atelectasis at both lung bases. Mildly enlarged 1.1 cm lower paraesophageal node (series 19/image 42). Hepatobiliary: No definite liver surface irregularity. No hepatic steatosis. There are multiple (at least 5) similar poorly marginated masses scattered in the liver, each demonstrating thick peripheral hyperenhancement and central fluid signal intensity without restricted diffusion, largest measuring 5.6 x 3.6 cm in segment 8 of the right liver lobe (series 19/ image 37), 4.8 x 3.6 cm in segment 8 of the right liver lobe (series 19/ image 57) and 2.0 x 1.5 cm in segment 5 of the right liver lobe (series 19/image 70). Cholecystectomy. There is moderate diffuse intrahepatic biliary ductal dilatation. Patient is status post choledochojejunostomy with common hepatic duct diameter 12 mm. No evidence of choledocholithiasis. Pancreas: Status post resection of the pancreatic head and pancreaticojejunostomy. Top-normal caliber main pancreatic duct in the remnant pancreas (2 mm diameter). No discrete pancreatic mass. Spleen: Normal size spleen. No splenic mass. Multiple small subcapsular peripheral foci of nonenhancement in the spleen compatible with small splenic infarcts. Adrenals/Urinary Tract: Normal adrenals. No hydronephrosis. Normal kidneys with no renal mass. Stomach/Bowel:  Status post distal gastrectomy and gastrojejunostomy. No acute abnormality in the nondistended remnant stomach. Visualized  small and large bowel is normal caliber, with no bowel wall thickening. Vascular/Lymphatic: Normal caliber abdominal aorta. There is an infiltrative retroperitoneal soft tissue mass centered at the root of the SMA and celiac trunk measuring approximately 5.8 x 3.9 x 5.0 cm (series 19/ image 73) and extending to the portosplenic venous confluence. There is extrinsic narrowing of the celiac trunk, at least high-grade (series 14/image 60). There is extrinsic narrowing of the SMA, which appears high-grade (series 14/ image 76). There is high-grade narrowing versus occlusion of the portosplenic venous confluence by the infiltrative mass, with a nonenhancing filling defect in the main portal vein compatible with bland thrombus (series 20/ image 67). There is at least high-grade narrowing of the left renal vein near the junction of the IVC by the mass (series 18/ image 57). Patent right renal vein. There are multiple enlarged upper retroperitoneal nodes in the gastrohepatic ligament, porta hepatis, aortocaval and para celiac chains. For example a 1.3 cm gastrohepatic ligament node (series 14/ image 62), a 1.7 cm porta hepatis node (series 14/ image 66) and a 1.1 cm aortocaval node (series 14/ image 77). Other: Small volume abdominal ascites. No focal fluid collection. Musculoskeletal: No aggressive appearing focal osseous lesions. IMPRESSION: 1. Infiltrative retroperitoneal soft tissue mass centered at the roots of the SMA and celiac trunk, most compatible with locally recurrent pancreatic malignancy. 2. Extrinsic narrowing (at least high-grade) of the proximal SMA, celiac trunk, portosplenic venous confluence and left renal vein by the retroperitoneal mass. CT angiography of the abdomen and pelvis with IV contrast may be considered for further characterization of these suspected malignant vascular  stenoses, as clinically warranted. 3. Multiple (at least 5) scattered similar poorly-marginated peripherally enhancing centrally cystic liver masses. Metastases are favored, although bilomas/early hepatic abscesses are on the differential especially given the significant vascular new stenoses described above. 4. Extensive upper retroperitoneal and mild lower mediastinal adenopathy, suspicious for nodal metastatic disease . 5. Small volume ascites. 6. Trace dependent bilateral pleural effusions. 7. Moderate diffuse intrahepatic biliary ductal dilatation. Status post choledochojejunostomy with common hepatic duct diameter 12 mm. No appreciable obstructing biliary stone or mass. 8. Multiple small splenic infarcts. Electronically Signed   By: Ilona Sorrel M.D.   On: 03/22/2017 08:14   Dg Chest Port 1 View  Result Date: 03/21/2017 CLINICAL DATA:  Acute respiratory failure. EXAM: PORTABLE CHEST 1 VIEW COMPARISON:  03/20/2017 FINDINGS: Heart size is normal. Mediastinal shadows are normal. Patient has taken a poor inspiration. Allowing for that, the lungs are probably clear. There could be mild basilar atelectasis. No dense consolidation or lobar collapse. No effusions. IMPRESSION: Poor inspiration.  Allowing for that, no active disease is suspected Electronically Signed   By: Nelson Chimes M.D.   On: 03/21/2017 06:45   Dg Chest Port 1 View  Result Date: 03/20/2017 CLINICAL DATA:  Hypoxia, fever and altered mental status. EXAM: PORTABLE CHEST 1 VIEW COMPARISON:  02/09/2018 FINDINGS: Poor inspiratory effort. Heart size is normal. Allowing for the poor inspiration, the lungs are probably clear. No edema or effusions. No acute bone finding. IMPRESSION: Poor inspiration.  No active disease suspected allowing for that. Electronically Signed   By: Nelson Chimes M.D.   On: 03/20/2017 16:14   US Abdomen Limited Ruq  Result Date: 03/21/2017 CLINICAL DATA:  Status post Whipple procedure for pancreatic cancer. EXAM: ULTRASOUND  ABDOMEN LIMITED RIGHT UPPER QUADRANT COMPARISON:  None. FINDINGS: Gallbladder: Status post cholecystectomy. Common bile duct: Diameter: 9 mm which is mildly dilated most consistent with  post cholecystectomy status. Liver: Increased echogenicity of hepatic parenchyma is noted. Intrahepatic biliary dilatation is noted. Multiple low densities are noted throughout hepatic parenchyma consistent with metastatic disease, the largest measuring 4.4 cm in right hepatic lobe. Portal vein is patent on color Doppler imaging with normal direction of blood flow towards the liver. IMPRESSION: Status post cholecystectomy. Mild intrahepatic and extrahepatic biliary dilatation is noted which may be due to post cholecystectomy status, but distal common bile duct obstruction cannot be excluded, and correlation with liver function tests is noted. Multiple hepatic masses are noted consistent with metastatic disease, the largest measuring 4.4 cm in right hepatic lobe. Electronically Signed   By: Marijo Conception, M.D.   On: 03/21/2017 09:05        Assessment/Plan:  47 yo white male with h/p pancreatic cancer admitted for sepsis/bactermia likely from GI source With Klebsiella pneumonia in setting of possible recurrent pancreatic cancer with mets Sepsis slowly improving  1.IVF's 2.Per ID will continue broad spectrum coverage in setting of likely polymicrobial infection.  Will continue cefepime 2g IV Q8hr. Will continue to monitor creatinine clearance; patient is borderline for renal adjustment - will continue aggressive dosing during this acute period.  Will initiate metronidazole 500mg  IV Q8hr 3.SD status 4.transfer to [facility] later today for further evaluation of abd mass  Code Status: full  Family Communication: discuss with patient and mother  Disposition Plan:Transfer to [facility]   Patient/Family are satisfied with Plan of action and management. All questions answered  Corrin Parker, M.D.  [facility]  Medical Director [place] Director [facility] Cardio-Pulmonary Department

## 2017-03-22 NOTE — Progress Notes (Signed)
[place] INFECTIOUS DISEASE PROGRESS NOTE Date of Admission:  03/20/2017     ID: William Lara is a 47 y.o. male with Klebsiella bacteremia, possible liver abscesses Active Problems:   Septic shock (HCC)   Subjective: No fevers, wbc down some.   ROS  Eleven systems are reviewed and negative except per hpi  Medications:  Antibiotics Given (last 72 hours)    Date/Time Action Medication Dose Rate   03/20/17 1632 New Bag/Given   piperacillin-tazobactam (ZOSYN) IVPB 3.375 g 3.375 g 100 mL/hr   03/20/17 1654 New Bag/Given   vancomycin (VANCOCIN) IVPB 1000 mg/200 mL premix 1,000 mg 200 mL/hr   03/20/17 2125 New Bag/Given   vancomycin (VANCOCIN) 1,500 mg in sodium chloride 0.9 % 500 mL IVPB 1,500 mg 250 mL/hr   03/20/17 2126 New Bag/Given   ceFEPIme (MAXIPIME) 2 g in dextrose 5 % 50 mL IVPB 2 g 100 mL/hr   03/21/17 0838 New Bag/Given   vancomycin (VANCOCIN) 1,500 mg in sodium chloride 0.9 % 500 mL IVPB 1,500 mg 250 mL/hr   03/21/17 1534 New Bag/Given   metroNIDAZOLE (FLAGYL) IVPB 500 mg 500 mg 100 mL/hr   03/21/17 1534 New Bag/Given   ceFEPIme (MAXIPIME) 2 g in dextrose 5 % 50 mL IVPB 2 g 100 mL/hr   03/21/17 2117 New Bag/Given   ceFEPIme (MAXIPIME) 2 g in dextrose 5 % 50 mL IVPB 2 g 100 mL/hr   03/21/17 2225 New Bag/Given   metroNIDAZOLE (FLAGYL) IVPB 500 mg 500 mg 100 mL/hr   03/22/17 0503 New Bag/Given   ceFEPIme (MAXIPIME) 2 g in dextrose 5 % 50 mL IVPB 2 g 100 mL/hr   03/22/17 0998 New Bag/Given   metroNIDAZOLE (FLAGYL) IVPB 500 mg 500 mg 100 mL/hr   03/22/17 1342 New Bag/Given   ceFEPIme (MAXIPIME) 2 g in dextrose 5 % 50 mL IVPB 2 g 100 mL/hr   03/22/17 1505 New Bag/Given   metroNIDAZOLE (FLAGYL) IVPB 500 mg 500 mg 100 mL/hr     . enoxaparin (LOVENOX) injection  40 mg Subcutaneous Q24H  . lipase/protease/amylase  36,000 Units Oral TID AC  . mouth rinse  15 mL Mouth Rinse BID    Objective: Vital signs in last 24 hours: Temp:  [97.7 F (36.5 C)-99 F (37.2  C)] 98.5 F (36.9 C) (01/04 1500) Pulse Rate:  [91-114] 110 (01/04 1500) Resp:  [8-23] 23 (01/04 1500) BP: (88-133)/(53-89) 115/71 (01/04 1500) SpO2:  [95 %-100 %] 95 % (01/04 1500) Weight:  [102.3 kg (225 lb 8.5 oz)] 102.3 kg (225 lb 8.5 oz) (01/04 0500) Constitutional: He is oriented to person, place, and time. He appears well-developed and well-nourished. No distress.  HENT:  Mouth/Throat: Oropharynx is clear and moist. No oropharyngeal exudate.  Cardiovascular: Normal rate, regular rhythm and normal heart sounds. Exam reveals no gallop and no friction rub.  No murmur heard.  Pulmonary/Chest: Effort normal and breath sounds normal. No respiratory distress. He has no wheezes.  Abdominal: Soft. Bowel sounds are normal. He exhibits no distension. There is no tenderness.  Lymphadenopathy:  He has no cervical adenopathy.  Neurological: He is alert and oriented to person, place, and time.  Skin: Skin is warm and dry. No rash noted. No erythema.  Psychiatric: He has a normal mood and affect. His behavior is normal.     Lab Results Recent Labs    03/21/17 0304 03/22/17 0531  WBC 16.9* 11.7*  HGB 11.6* 10.4*  HCT 35.4* 31.3*  NA 132* 132*  K  4.0 3.5  CL 103 105  CO2 18* 21*  BUN 25* 38*  CREATININE 2.02* 2.27*    Microbiology: @micro @ Studies/Results: Ct Head Wo Contrast  Result Date: 03/20/2017 CLINICAL DATA:  Unresponsive, history of pancreatic carcinoma EXAM: CT HEAD WITHOUT CONTRAST TECHNIQUE: Contiguous axial images were obtained from the base of the skull through the vertex without intravenous contrast. COMPARISON:  None. FINDINGS: Brain: No evidence of acute infarction, hemorrhage, hydrocephalus, extra-axial collection or mass lesion/mass effect. Vascular: No hyperdense vessel or unexpected calcification. Skull: Normal. Negative for fracture or focal lesion. Sinuses/Orbits: No acute finding. Other: None. IMPRESSION: No acute intracranial abnormality noted. Electronically  Signed   By: Inez Catalina M.D.   On: 03/20/2017 16:27   Mr Abdomen W NI Contrast  Result Date: 03/22/2017 CLINICAL DATA:  Inpatient. History of Whipple procedure for pancreatic cancer 2 years prior. Multiple liver masses at sonography. EXAM: MRI ABDOMEN WITHOUT AND WITH CONTRAST TECHNIQUE: Multiplanar multisequence MR imaging of the abdomen was performed both before and after the administration of intravenous contrast. CONTRAST:  33mL MULTIHANCE GADOBENATE DIMEGLUMINE 529 MG/ML IV SOLN COMPARISON:  03/21/2017 abdominal sonogram. FINDINGS: Lower chest: Mild elevation of the right hemidiaphragm. Trace dependent pleural effusions and mild dependent lower lobe atelectasis at both lung bases. Mildly enlarged 1.1 cm lower paraesophageal node (series 19/image 42). Hepatobiliary: No definite liver surface irregularity. No hepatic steatosis. There are multiple (at least 5) similar poorly marginated masses scattered in the liver, each demonstrating thick peripheral hyperenhancement and central fluid signal intensity without restricted diffusion, largest measuring 5.6 x 3.6 cm in segment 8 of the right liver lobe (series 19/ image 37), 4.8 x 3.6 cm in segment 8 of the right liver lobe (series 19/ image 57) and 2.0 x 1.5 cm in segment 5 of the right liver lobe (series 19/image 70). Cholecystectomy. There is moderate diffuse intrahepatic biliary ductal dilatation. Patient is status post choledochojejunostomy with common hepatic duct diameter 12 mm. No evidence of choledocholithiasis. Pancreas: Status post resection of the pancreatic head and pancreaticojejunostomy. Top-normal caliber main pancreatic duct in the remnant pancreas (2 mm diameter). No discrete pancreatic mass. Spleen: Normal size spleen. No splenic mass. Multiple small subcapsular peripheral foci of nonenhancement in the spleen compatible with small splenic infarcts. Adrenals/Urinary Tract: Normal adrenals. No hydronephrosis. Normal kidneys with no renal mass.  Stomach/Bowel: Status post distal gastrectomy and gastrojejunostomy. No acute abnormality in the nondistended remnant stomach. Visualized small and large bowel is normal caliber, with no bowel wall thickening. Vascular/Lymphatic: Normal caliber abdominal aorta. There is an infiltrative retroperitoneal soft tissue mass centered at the root of the SMA and celiac trunk measuring approximately 5.8 x 3.9 x 5.0 cm (series 19/ image 73) and extending to the portosplenic venous confluence. There is extrinsic narrowing of the celiac trunk, at least high-grade (series 14/image 60). There is extrinsic narrowing of the SMA, which appears high-grade (series 14/ image 76). There is high-grade narrowing versus occlusion of the portosplenic venous confluence by the infiltrative mass, with a nonenhancing filling defect in the main portal vein compatible with bland thrombus (series 20/ image 67). There is at least high-grade narrowing of the left renal vein near the junction of the IVC by the mass (series 18/ image 57). Patent right renal vein. There are multiple enlarged upper retroperitoneal nodes in the gastrohepatic ligament, porta hepatis, aortocaval and para celiac chains. For example a 1.3 cm gastrohepatic ligament node (series 14/ image 62), a 1.7 cm porta hepatis node (series 14/ image 66) and a  1.1 cm aortocaval node (series 14/ image 77). Other: Small volume abdominal ascites. No focal fluid collection. Musculoskeletal: No aggressive appearing focal osseous lesions. IMPRESSION: 1. Infiltrative retroperitoneal soft tissue mass centered at the roots of the SMA and celiac trunk, most compatible with locally recurrent pancreatic malignancy. 2. Extrinsic narrowing (at least high-grade) of the proximal SMA, celiac trunk, portosplenic venous confluence and left renal vein by the retroperitoneal mass. CT angiography of the abdomen and pelvis with IV contrast may be considered for further characterization of these suspected  malignant vascular stenoses, as clinically warranted. 3. Multiple (at least 5) scattered similar poorly-marginated peripherally enhancing centrally cystic liver masses. Metastases are favored, although bilomas/early hepatic abscesses are on the differential especially given the significant vascular new stenoses described above. 4. Extensive upper retroperitoneal and mild lower mediastinal adenopathy, suspicious for nodal metastatic disease . 5. Small volume ascites. 6. Trace dependent bilateral pleural effusions. 7. Moderate diffuse intrahepatic biliary ductal dilatation. Status post choledochojejunostomy with common hepatic duct diameter 12 mm. No appreciable obstructing biliary stone or mass. 8. Multiple small splenic infarcts. Electronically Signed   By: Ilona Sorrel M.D.   On: 03/22/2017 08:14   Dg Chest Port 1 View  Result Date: 03/21/2017 CLINICAL DATA:  Acute respiratory failure. EXAM: PORTABLE CHEST 1 VIEW COMPARISON:  03/20/2017 FINDINGS: Heart size is normal. Mediastinal shadows are normal. Patient has taken a poor inspiration. Allowing for that, the lungs are probably clear. There could be mild basilar atelectasis. No dense consolidation or lobar collapse. No effusions. IMPRESSION: Poor inspiration.  Allowing for that, no active disease is suspected Electronically Signed   By: Nelson Chimes M.D.   On: 03/21/2017 06:45   Dg Chest Port 1 View  Result Date: 03/20/2017 CLINICAL DATA:  Hypoxia, fever and altered mental status. EXAM: PORTABLE CHEST 1 VIEW COMPARISON:  02/09/2018 FINDINGS: Poor inspiratory effort. Heart size is normal. Allowing for the poor inspiration, the lungs are probably clear. No edema or effusions. No acute bone finding. IMPRESSION: Poor inspiration.  No active disease suspected allowing for that. Electronically Signed   By: Nelson Chimes M.D.   On: 03/20/2017 16:14   US Abdomen Limited Ruq  Result Date: 03/21/2017 CLINICAL DATA:  Status post Whipple procedure for pancreatic  cancer. EXAM: ULTRASOUND ABDOMEN LIMITED RIGHT UPPER QUADRANT COMPARISON:  None. FINDINGS: Gallbladder: Status post cholecystectomy. Common bile duct: Diameter: 9 mm which is mildly dilated most consistent with post cholecystectomy status. Liver: Increased echogenicity of hepatic parenchyma is noted. Intrahepatic biliary dilatation is noted. Multiple low densities are noted throughout hepatic parenchyma consistent with metastatic disease, the largest measuring 4.4 cm in right hepatic lobe. Portal vein is patent on color Doppler imaging with normal direction of blood flow towards the liver. IMPRESSION: Status post cholecystectomy. Mild intrahepatic and extrahepatic biliary dilatation is noted which may be due to post cholecystectomy status, but distal common bile duct obstruction cannot be excluded, and correlation with liver function tests is noted. Multiple hepatic masses are noted consistent with metastatic disease, the largest measuring 4.4 cm in right hepatic lobe. Electronically Signed   By: Marijo Conception, M.D.   On: 03/21/2017 09:05    Assessment/Plan: GIRARD KOONTZ is a 47 y.o. male with pancreatitic cancer s/p Whipples procedure admitted with several days of fever, confusion, body aches and found to have sepsis with BCX + Klebsiella Pna and Oxytoca and LUQ USS showing multiple hepatic masses concerning for malignancy. HE had seen onc in the fall and had neg CT scan  and is due for repeat imaging .Clinically improving.   Recommendations Cont cefepime and flagyl .  Consider aspiration or bxp of liver lesions.   Thank you very much for the consult. Will follow with you.  Leonel Ramsay   03/22/2017, 3:58 PM

## 2017-03-22 NOTE — Discharge Summary (Signed)
DISCHARGE SUMMARY  CHIEF COMPLAINT:   Chief Complaint  Patient presents with  . Altered Mental Status   Admitted DX SEPSIS DISCHARGE DX KLEBSIELLA BACTEREMIA RECURRENT PANCREATIC CANCER WITH METS  Subjective  Family and patient notified of [place] [facility] Course Admitted to ICU Weaned off vasopressors IVF's approx 8 liters given IV abx started LA improving  MRI ABD SHOWS SOFT TISSUE MASS THAT IS INFILTRATING ABD BLOOD VESSELS   Objective   Examination:  General exam: ill appearing Respiratory system: Clear to auscultation. Respiratory effort normal. HEENT: [place]/AT, PERRLA, no thrush, no stridor. Cardiovascular system: S1 & S2 heard, RRR. No JVD, murmurs, rubs, gallops or clicks. No pedal edema. Gastrointestinal system: Abdomen is nondistended, soft and nontender. No organomegaly or masses felt. Normal bowel sounds heard. Central nervous system: Alert and oriented. No focal neurological deficits. Extremities: Symmetric 5 x 5 power. Skin: No rashes, lesions or ulcers Psychiatry: Judgement and insight appear normal. Mood & affect appropriate.   VITALS:  height is 6\' 4"  (1.93 m) and weight is 225 lb 8.5 oz (102.3 kg). His axillary temperature is 98 F (36.7 C). His blood pressure is 123/80 and his pulse is 103 (abnormal). His respiration is 19 and oxygen saturation is 96%.   I personally reviewed Labs under Results section.  Radiology Reports Ct Head Wo Contrast  Result Date: 03/20/2017 CLINICAL DATA:  Unresponsive, history of pancreatic carcinoma EXAM: CT HEAD WITHOUT CONTRAST TECHNIQUE: Contiguous axial images were obtained from the base of the skull through the vertex without intravenous contrast. COMPARISON:  None. FINDINGS: Brain: No evidence of acute infarction, hemorrhage, hydrocephalus, extra-axial collection or mass lesion/mass effect. Vascular: No hyperdense vessel or unexpected calcification. Skull: Normal. Negative for fracture or focal lesion. Sinuses/Orbits:  No acute finding. Other: None. IMPRESSION: No acute intracranial abnormality noted. Electronically Signed   By: Inez Catalina M.D.   On: 03/20/2017 16:27   Mr Abdomen W WR Contrast  Result Date: 03/22/2017 CLINICAL DATA:  Inpatient. History of Whipple procedure for pancreatic cancer 2 years prior. Multiple liver masses at sonography. EXAM: MRI ABDOMEN WITHOUT AND WITH CONTRAST TECHNIQUE: Multiplanar multisequence MR imaging of the abdomen was performed both before and after the administration of intravenous contrast. CONTRAST:  43mL MULTIHANCE GADOBENATE DIMEGLUMINE 529 MG/ML IV SOLN COMPARISON:  03/21/2017 abdominal sonogram. FINDINGS: Lower chest: Mild elevation of the right hemidiaphragm. Trace dependent pleural effusions and mild dependent lower lobe atelectasis at both lung bases. Mildly enlarged 1.1 cm lower paraesophageal node (series 19/image 42). Hepatobiliary: No definite liver surface irregularity. No hepatic steatosis. There are multiple (at least 5) similar poorly marginated masses scattered in the liver, each demonstrating thick peripheral hyperenhancement and central fluid signal intensity without restricted diffusion, largest measuring 5.6 x 3.6 cm in segment 8 of the right liver lobe (series 19/ image 37), 4.8 x 3.6 cm in segment 8 of the right liver lobe (series 19/ image 57) and 2.0 x 1.5 cm in segment 5 of the right liver lobe (series 19/image 70). Cholecystectomy. There is moderate diffuse intrahepatic biliary ductal dilatation. Patient is status post choledochojejunostomy with common hepatic duct diameter 12 mm. No evidence of choledocholithiasis. Pancreas: Status post resection of the pancreatic head and pancreaticojejunostomy. Top-normal caliber main pancreatic duct in the remnant pancreas (2 mm diameter). No discrete pancreatic mass. Spleen: Normal size spleen. No splenic mass. Multiple small subcapsular peripheral foci of nonenhancement in the spleen compatible with small splenic  infarcts. Adrenals/Urinary Tract: Normal adrenals. No hydronephrosis. Normal kidneys with no  renal mass. Stomach/Bowel: Status post distal gastrectomy and gastrojejunostomy. No acute abnormality in the nondistended remnant stomach. Visualized small and large bowel is normal caliber, with no bowel wall thickening. Vascular/Lymphatic: Normal caliber abdominal aorta. There is an infiltrative retroperitoneal soft tissue mass centered at the root of the SMA and celiac trunk measuring approximately 5.8 x 3.9 x 5.0 cm (series 19/ image 73) and extending to the portosplenic venous confluence. There is extrinsic narrowing of the celiac trunk, at least high-grade (series 14/image 60). There is extrinsic narrowing of the SMA, which appears high-grade (series 14/ image 76). There is high-grade narrowing versus occlusion of the portosplenic venous confluence by the infiltrative mass, with a nonenhancing filling defect in the main portal vein compatible with bland thrombus (series 20/ image 67). There is at least high-grade narrowing of the left renal vein near the junction of the IVC by the mass (series 18/ image 57). Patent right renal vein. There are multiple enlarged upper retroperitoneal nodes in the gastrohepatic ligament, porta hepatis, aortocaval and para celiac chains. For example a 1.3 cm gastrohepatic ligament node (series 14/ image 62), a 1.7 cm porta hepatis node (series 14/ image 66) and a 1.1 cm aortocaval node (series 14/ image 77). Other: Small volume abdominal ascites. No focal fluid collection. Musculoskeletal: No aggressive appearing focal osseous lesions. IMPRESSION: 1. Infiltrative retroperitoneal soft tissue mass centered at the roots of the SMA and celiac trunk, most compatible with locally recurrent pancreatic malignancy. 2. Extrinsic narrowing (at least high-grade) of the proximal SMA, celiac trunk, portosplenic venous confluence and left renal vein by the retroperitoneal mass. CT angiography of the  abdomen and pelvis with IV contrast may be considered for further characterization of these suspected malignant vascular stenoses, as clinically warranted. 3. Multiple (at least 5) scattered similar poorly-marginated peripherally enhancing centrally cystic liver masses. Metastases are favored, although bilomas/early hepatic abscesses are on the differential especially given the significant vascular new stenoses described above. 4. Extensive upper retroperitoneal and mild lower mediastinal adenopathy, suspicious for nodal metastatic disease . 5. Small volume ascites. 6. Trace dependent bilateral pleural effusions. 7. Moderate diffuse intrahepatic biliary ductal dilatation. Status post choledochojejunostomy with common hepatic duct diameter 12 mm. No appreciable obstructing biliary stone or mass. 8. Multiple small splenic infarcts. Electronically Signed   By: Ilona Sorrel M.D.   On: 03/22/2017 08:14   Dg Chest Port 1 View  Result Date: 03/21/2017 CLINICAL DATA:  Acute respiratory failure. EXAM: PORTABLE CHEST 1 VIEW COMPARISON:  03/20/2017 FINDINGS: Heart size is normal. Mediastinal shadows are normal. Patient has taken a poor inspiration. Allowing for that, the lungs are probably clear. There could be mild basilar atelectasis. No dense consolidation or lobar collapse. No effusions. IMPRESSION: Poor inspiration.  Allowing for that, no active disease is suspected Electronically Signed   By: Nelson Chimes M.D.   On: 03/21/2017 06:45   Dg Chest Port 1 View  Result Date: 03/20/2017 CLINICAL DATA:  Hypoxia, fever and altered mental status. EXAM: PORTABLE CHEST 1 VIEW COMPARISON:  02/09/2018 FINDINGS: Poor inspiratory effort. Heart size is normal. Allowing for the poor inspiration, the lungs are probably clear. No edema or effusions. No acute bone finding. IMPRESSION: Poor inspiration.  No active disease suspected allowing for that. Electronically Signed   By: Nelson Chimes M.D.   On: 03/20/2017 16:14   US Abdomen  Limited Ruq  Result Date: 03/21/2017 CLINICAL DATA:  Status post Whipple procedure for pancreatic cancer. EXAM: ULTRASOUND ABDOMEN LIMITED RIGHT UPPER QUADRANT COMPARISON:  None.  FINDINGS: Gallbladder: Status post cholecystectomy. Common bile duct: Diameter: 9 mm which is mildly dilated most consistent with post cholecystectomy status. Liver: Increased echogenicity of hepatic parenchyma is noted. Intrahepatic biliary dilatation is noted. Multiple low densities are noted throughout hepatic parenchyma consistent with metastatic disease, the largest measuring 4.4 cm in right hepatic lobe. Portal vein is patent on color Doppler imaging with normal direction of blood flow towards the liver. IMPRESSION: Status post cholecystectomy. Mild intrahepatic and extrahepatic biliary dilatation is noted which may be due to post cholecystectomy status, but distal common bile duct obstruction cannot be excluded, and correlation with liver function tests is noted. Multiple hepatic masses are noted consistent with metastatic disease, the largest measuring 4.4 cm in right hepatic lobe. Electronically Signed   By: Marijo Conception, M.D.   On: 03/21/2017 09:05        Assessment/Plan:  47 yo white male with h/p pancreatic cancer admitted for sepsis/bactermia likely from GI source With Klebsiella pneumonia in setting of possible recurrent pancreatic cancer with mets   CASE DISCUSSED WITH [facility] THEY HAVE ACCEPTED PATIENT FOR TRANSFER   DISCHARGE MEDS/ABX  Anti-infectives (From admission, onward)   Start     Dose/Rate Route Frequency Ordered Stop   03/21/17 2200  ceFEPIme (MAXIPIME) 2 g in dextrose 5 % 50 mL IVPB  Status:  Discontinued     2 g 100 mL/hr over 30 Minutes Intravenous Every 8 hours 03/21/17 1018 03/21/17 1513   03/21/17 1530  metroNIDAZOLE (FLAGYL) IVPB 500 mg     500 mg 100 mL/hr over 60 Minutes Intravenous Every 8 hours 03/21/17 1507     03/21/17 1530  ceFEPIme (MAXIPIME) 2 g in dextrose 5 % 50  mL IVPB     2 g 100 mL/hr over 30 Minutes Intravenous Every 8 hours 03/21/17 1513     03/21/17 1000  ceFEPIme (MAXIPIME) 2 g in dextrose 5 % 50 mL IVPB  Status:  Discontinued     2 g 100 mL/hr over 30 Minutes Intravenous Every 12 hours 03/21/17 0421 03/21/17 1018   03/20/17 2200  ceFEPIme (MAXIPIME) 2 g in dextrose 5 % 50 mL IVPB  Status:  Discontinued     2 g 100 mL/hr over 30 Minutes Intravenous Every 8 hours 03/20/17 2110 03/21/17 0421   03/20/17 2100  vancomycin (VANCOCIN) 1,500 mg in sodium chloride 0.9 % 500 mL IVPB  Status:  Discontinued     1,500 mg 250 mL/hr over 120 Minutes Intravenous Every 12 hours 03/20/17 1826 03/21/17 1018   03/20/17 2000  piperacillin-tazobactam (ZOSYN) IVPB 3.375 g  Status:  Discontinued     3.375 g 12.5 mL/hr over 240 Minutes Intravenous Every 8 hours 03/20/17 1750 03/20/17 2111   03/20/17 1600  piperacillin-tazobactam (ZOSYN) IVPB 3.375 g     3.375 g 100 mL/hr over 30 Minutes Intravenous  Once 03/20/17 1549 03/20/17 1702   03/20/17 1600  vancomycin (VANCOCIN) IVPB 1000 mg/200 mL premix     1,000 mg 200 mL/hr over 60 Minutes Intravenous  Once 03/20/17 1549 03/20/17 1754       Code Status: full  Family Communication: discuss with patient and mother  Disposition Plan:Transfer to [employer] are satisfied with Plan of action and management. All questions answered  Corrin Parker, M.D.  [facility]  Medical Director [place] Director [facility] Cardio-Pulmonary Department

## 2017-03-23 LAB — CULTURE, BLOOD (ROUTINE X 2)
SPECIAL REQUESTS: ADEQUATE
Special Requests: ADEQUATE

## 2017-03-24 LAB — CSF CULTURE

## 2017-03-24 LAB — CSF CULTURE W GRAM STAIN: Culture: NO GROWTH

## 2017-03-30 LAB — BLOOD GAS, ARTERIAL
Acid-base deficit: 2.2 mmol/L — ABNORMAL HIGH (ref 0.0–2.0)
Allens test (pass/fail): POSITIVE — AB
Bicarbonate: 20.1 mmol/L (ref 20.0–28.0)
FIO2: 0.44
O2 SAT: 95.8 %
PCO2 ART: 27 mmHg — AB (ref 32.0–48.0)
PO2 ART: 74 mmHg — AB (ref 83.0–108.0)
Patient temperature: 37
pH, Arterial: 7.48 — ABNORMAL HIGH (ref 7.350–7.450)

## 2017-05-10 ENCOUNTER — Emergency Department: Payer: BLUE CROSS/BLUE SHIELD

## 2017-05-10 ENCOUNTER — Inpatient Hospital Stay: Payer: BLUE CROSS/BLUE SHIELD

## 2017-05-10 ENCOUNTER — Other Ambulatory Visit: Payer: Self-pay

## 2017-05-10 ENCOUNTER — Inpatient Hospital Stay
Admission: EM | Admit: 2017-05-10 | Discharge: 2017-05-17 | DRG: 871 | Disposition: E | Payer: BLUE CROSS/BLUE SHIELD | Attending: Internal Medicine | Admitting: Internal Medicine

## 2017-05-10 ENCOUNTER — Encounter: Payer: Self-pay | Admitting: Internal Medicine

## 2017-05-10 DIAGNOSIS — Z7901 Long term (current) use of anticoagulants: Secondary | ICD-10-CM

## 2017-05-10 DIAGNOSIS — Z91013 Allergy to seafood: Secondary | ICD-10-CM

## 2017-05-10 DIAGNOSIS — Z8507 Personal history of malignant neoplasm of pancreas: Secondary | ICD-10-CM

## 2017-05-10 DIAGNOSIS — I10 Essential (primary) hypertension: Secondary | ICD-10-CM | POA: Diagnosis present

## 2017-05-10 DIAGNOSIS — R569 Unspecified convulsions: Secondary | ICD-10-CM | POA: Diagnosis present

## 2017-05-10 DIAGNOSIS — R6521 Severe sepsis with septic shock: Secondary | ICD-10-CM | POA: Diagnosis present

## 2017-05-10 DIAGNOSIS — T85618A Breakdown (mechanical) of other specified internal prosthetic devices, implants and grafts, initial encounter: Secondary | ICD-10-CM | POA: Diagnosis present

## 2017-05-10 DIAGNOSIS — L0291 Cutaneous abscess, unspecified: Secondary | ICD-10-CM

## 2017-05-10 DIAGNOSIS — Z91041 Radiographic dye allergy status: Secondary | ICD-10-CM | POA: Diagnosis not present

## 2017-05-10 DIAGNOSIS — R1084 Generalized abdominal pain: Secondary | ICD-10-CM

## 2017-05-10 DIAGNOSIS — N179 Acute kidney failure, unspecified: Secondary | ICD-10-CM | POA: Diagnosis present

## 2017-05-10 DIAGNOSIS — A419 Sepsis, unspecified organism: Secondary | ICD-10-CM | POA: Diagnosis present

## 2017-05-10 DIAGNOSIS — Z87891 Personal history of nicotine dependence: Secondary | ICD-10-CM

## 2017-05-10 DIAGNOSIS — Z4682 Encounter for fitting and adjustment of non-vascular catheter: Secondary | ICD-10-CM

## 2017-05-10 DIAGNOSIS — K750 Abscess of liver: Secondary | ICD-10-CM | POA: Diagnosis present

## 2017-05-10 DIAGNOSIS — Z01818 Encounter for other preprocedural examination: Secondary | ICD-10-CM

## 2017-05-10 DIAGNOSIS — Z86711 Personal history of pulmonary embolism: Secondary | ICD-10-CM

## 2017-05-10 DIAGNOSIS — K7200 Acute and subacute hepatic failure without coma: Secondary | ICD-10-CM | POA: Diagnosis present

## 2017-05-10 DIAGNOSIS — Z9221 Personal history of antineoplastic chemotherapy: Secondary | ICD-10-CM | POA: Diagnosis not present

## 2017-05-10 DIAGNOSIS — Z66 Do not resuscitate: Secondary | ICD-10-CM | POA: Diagnosis present

## 2017-05-10 DIAGNOSIS — R109 Unspecified abdominal pain: Secondary | ICD-10-CM

## 2017-05-10 DIAGNOSIS — K911 Postgastric surgery syndromes: Secondary | ICD-10-CM | POA: Diagnosis present

## 2017-05-10 DIAGNOSIS — E872 Acidosis: Secondary | ICD-10-CM | POA: Diagnosis present

## 2017-05-10 DIAGNOSIS — J9600 Acute respiratory failure, unspecified whether with hypoxia or hypercapnia: Secondary | ICD-10-CM | POA: Diagnosis not present

## 2017-05-10 DIAGNOSIS — D65 Disseminated intravascular coagulation [defibrination syndrome]: Secondary | ICD-10-CM | POA: Diagnosis present

## 2017-05-10 DIAGNOSIS — Y838 Other surgical procedures as the cause of abnormal reaction of the patient, or of later complication, without mention of misadventure at the time of the procedure: Secondary | ICD-10-CM | POA: Diagnosis present

## 2017-05-10 DIAGNOSIS — Z800 Family history of malignant neoplasm of digestive organs: Secondary | ICD-10-CM | POA: Diagnosis not present

## 2017-05-10 DIAGNOSIS — C772 Secondary and unspecified malignant neoplasm of intra-abdominal lymph nodes: Secondary | ICD-10-CM | POA: Diagnosis present

## 2017-05-10 DIAGNOSIS — K819 Cholecystitis, unspecified: Secondary | ICD-10-CM

## 2017-05-10 HISTORY — PX: IR CATHETER TUBE CHANGE: IMG717

## 2017-05-10 LAB — CBC WITH DIFFERENTIAL/PLATELET
BASOS ABS: 0 10*3/uL (ref 0–0.1)
BASOS ABS: 0 10*3/uL (ref 0–0.1)
BASOS PCT: 0 %
Band Neutrophils: 18 %
Basophils Relative: 0 %
Blasts: 0 %
EOS PCT: 0 %
EOS PCT: 1 %
Eosinophils Absolute: 0 10*3/uL (ref 0–0.7)
Eosinophils Absolute: 0 10*3/uL (ref 0–0.7)
HCT: 21.1 % — ABNORMAL LOW (ref 40.0–52.0)
HEMATOCRIT: 29.7 % — AB (ref 40.0–52.0)
HEMOGLOBIN: 6.6 g/dL — AB (ref 13.0–18.0)
Hemoglobin: 9.8 g/dL — ABNORMAL LOW (ref 13.0–18.0)
LYMPHS ABS: 0.9 10*3/uL — AB (ref 1.0–3.6)
Lymphocytes Relative: 5 %
Lymphocytes Relative: 19 %
Lymphs Abs: 0.7 10*3/uL — ABNORMAL LOW (ref 1.0–3.6)
MCH: 29.8 pg (ref 26.0–34.0)
MCH: 30.2 pg (ref 26.0–34.0)
MCHC: 33 g/dL (ref 32.0–36.0)
MCHC: 31.2 g/dL — ABNORMAL LOW (ref 32.0–36.0)
MCV: 90.1 fL (ref 80.0–100.0)
MCV: 97 fL (ref 80.0–100.0)
METAMYELOCYTES PCT: 0 %
MONO ABS: 0.4 10*3/uL (ref 0.2–1.0)
MYELOCYTES: 0 %
Monocytes Absolute: 0.5 10*3/uL (ref 0.2–1.0)
Monocytes Relative: 3 %
Monocytes Relative: 12 %
NEUTROS ABS: 11.7 10*3/uL — AB (ref 1.4–6.5)
Neutro Abs: 3.1 10*3/uL (ref 1.4–6.5)
Neutrophils Relative %: 92 %
Neutrophils Relative %: 50 %
Other: 0 %
PLATELETS: 453 10*3/uL — AB (ref 150–440)
Platelets: 278 10*3/uL (ref 150–440)
Promyelocytes Absolute: 0 %
RBC: 3.3 MIL/uL — ABNORMAL LOW (ref 4.40–5.90)
RBC: 2.17 MIL/uL — AB (ref 4.40–5.90)
RDW: 18.6 % — AB (ref 11.5–14.5)
RDW: 19.7 % — ABNORMAL HIGH (ref 11.5–14.5)
WBC: 12.8 10*3/uL — ABNORMAL HIGH (ref 3.8–10.6)
WBC: 4.5 10*3/uL (ref 3.8–10.6)
nRBC: 0 /100 WBC

## 2017-05-10 LAB — COMPREHENSIVE METABOLIC PANEL
ALT: 58 U/L (ref 17–63)
ANION GAP: 13 (ref 5–15)
AST: 63 U/L — ABNORMAL HIGH (ref 15–41)
Albumin: 2.9 g/dL — ABNORMAL LOW (ref 3.5–5.0)
Alkaline Phosphatase: 96 U/L (ref 38–126)
BUN: 53 mg/dL — ABNORMAL HIGH (ref 6–20)
CHLORIDE: 103 mmol/L (ref 101–111)
CO2: 15 mmol/L — ABNORMAL LOW (ref 22–32)
CREATININE: 2.53 mg/dL — AB (ref 0.61–1.24)
Calcium: 8 mg/dL — ABNORMAL LOW (ref 8.9–10.3)
GFR, EST AFRICAN AMERICAN: 34 mL/min — AB (ref >60)
GFR, EST NON AFRICAN AMERICAN: 29 mL/min — AB (ref >60)
Glucose, Bld: 113 mg/dL — ABNORMAL HIGH (ref 65–99)
Potassium: 3.6 mmol/L (ref 3.5–5.1)
Sodium: 131 mmol/L — ABNORMAL LOW (ref 135–145)
Total Bilirubin: 4.4 mg/dL — ABNORMAL HIGH (ref 0.3–1.2)
Total Protein: 6.2 g/dL — ABNORMAL LOW (ref 6.5–8.1)

## 2017-05-10 LAB — URINALYSIS, COMPLETE (UACMP) WITH MICROSCOPIC
Bacteria, UA: NONE SEEN
Bilirubin Urine: NEGATIVE
Glucose, UA: NEGATIVE mg/dL
Hgb urine dipstick: NEGATIVE
Ketones, ur: NEGATIVE mg/dL
Leukocytes, UA: NEGATIVE
Nitrite: NEGATIVE
Protein, ur: 100 mg/dL — AB
Specific Gravity, Urine: 1.019 (ref 1.005–1.030)
pH: 5 (ref 5.0–8.0)

## 2017-05-10 LAB — BLOOD GAS, ARTERIAL
ACID-BASE DEFICIT: 27.6 mmol/L — AB (ref 0.0–2.0)
BICARBONATE: 4 mmol/L — AB (ref 20.0–28.0)
FIO2: 0.35
MECHVT: 500 mL
O2 Saturation: 98.3 %
PATIENT TEMPERATURE: 37
PEEP/CPAP: 5 cmH2O
PH ART: 6.91 — AB (ref 7.350–7.450)
PO2 ART: 170 mmHg — AB (ref 83.0–108.0)
RATE: 20 resp/min
pCO2 arterial: 20 mmHg — ABNORMAL LOW (ref 32.0–48.0)

## 2017-05-10 LAB — INFLUENZA PANEL BY PCR (TYPE A & B)
INFLAPCR: NEGATIVE
INFLBPCR: NEGATIVE

## 2017-05-10 LAB — PREPARE RBC (CROSSMATCH)

## 2017-05-10 LAB — LACTIC ACID, PLASMA
LACTIC ACID, VENOUS: 12.3 mmol/L — AB (ref 0.5–1.9)
Lactic Acid, Venous: 4.6 mmol/L (ref 0.5–1.9)
Lactic Acid, Venous: 6.8 mmol/L (ref 0.5–1.9)
Lactic Acid, Venous: 17.8 mmol/L (ref 0.5–1.9)

## 2017-05-10 LAB — PROCALCITONIN: Procalcitonin: 14.06 ng/mL

## 2017-05-10 LAB — FIBRINOGEN: Fibrinogen: 217 mg/dL (ref 210–475)

## 2017-05-10 LAB — TSH: TSH: 0.684 u[IU]/mL (ref 0.350–4.500)

## 2017-05-10 LAB — PROTIME-INR
INR: 1.79
INR: 5.26
PROTHROMBIN TIME: 47.9 s — AB (ref 11.4–15.2)
Prothrombin Time: 20.6 seconds — ABNORMAL HIGH (ref 11.4–15.2)

## 2017-05-10 LAB — GLUCOSE, CAPILLARY: Glucose-Capillary: 82 mg/dL (ref 65–99)

## 2017-05-10 LAB — MRSA PCR SCREENING: MRSA BY PCR: NEGATIVE

## 2017-05-10 LAB — APTT: APTT: 56 s — AB (ref 24–36)

## 2017-05-10 LAB — C DIFFICILE QUICK SCREEN W PCR REFLEX
C DIFFICILE (CDIFF) TOXIN: NEGATIVE
C Diff antigen: NEGATIVE
C Diff interpretation: NOT DETECTED

## 2017-05-10 LAB — ABO/RH: ABO/RH(D): A POS

## 2017-05-10 LAB — FIBRIN DERIVATIVES D-DIMER (ARMC ONLY): Fibrin derivatives D-dimer (ARMC): >7500 ng/mL (FEU) — ABNORMAL HIGH (ref 0.00–499.00)

## 2017-05-10 MED ORDER — SODIUM CHLORIDE 0.9 % IV BOLUS (SEPSIS)
500.0000 mL | Freq: Once | INTRAVENOUS | Status: AC
  Administered 2017-05-10: 500 mL via INTRAVENOUS

## 2017-05-10 MED ORDER — ORAL CARE MOUTH RINSE
15.0000 mL | Freq: Two times a day (BID) | OROMUCOSAL | Status: DC
  Administered 2017-05-10 – 2017-05-11 (×2): 15 mL via OROMUCOSAL

## 2017-05-10 MED ORDER — FENTANYL 2500MCG IN NS 250ML (10MCG/ML) PREMIX INFUSION
INTRAVENOUS | Status: AC
  Filled 2017-05-10: qty 250

## 2017-05-10 MED ORDER — FENTANYL CITRATE (PF) 100 MCG/2ML IJ SOLN
INTRAMUSCULAR | Status: AC | PRN
  Administered 2017-05-10 (×2): 25 ug via INTRAVENOUS

## 2017-05-10 MED ORDER — SODIUM CHLORIDE 0.9 % IV BOLUS (SEPSIS)
1000.0000 mL | INTRAVENOUS | Status: AC
  Administered 2017-05-10 (×2): 1000 mL via INTRAVENOUS

## 2017-05-10 MED ORDER — ONDANSETRON HCL 4 MG/2ML IJ SOLN
INTRAMUSCULAR | Status: AC
  Filled 2017-05-10: qty 2

## 2017-05-10 MED ORDER — SODIUM BICARBONATE 8.4 % IV SOLN
200.0000 meq | Freq: Once | INTRAVENOUS | Status: AC
  Administered 2017-05-10: 200 meq via INTRAVENOUS

## 2017-05-10 MED ORDER — FENTANYL CITRATE (PF) 100 MCG/2ML IJ SOLN: 50.0000 ug | Freq: Once | INTRAMUSCULAR | Status: DC

## 2017-05-10 MED ORDER — ACETAMINOPHEN 650 MG RE SUPP
650.0000 mg | Freq: Once | RECTAL | Status: AC
  Administered 2017-05-10: 650 mg via RECTAL
  Filled 2017-05-10: qty 1

## 2017-05-10 MED ORDER — SODIUM CHLORIDE 0.9 % IV SOLN
1.0000 g | Freq: Two times a day (BID) | INTRAVENOUS | Status: DC
  Administered 2017-05-10 – 2017-05-11 (×2): 1 g via INTRAVENOUS
  Filled 2017-05-10 (×4): qty 1

## 2017-05-10 MED ORDER — MIDAZOLAM HCL 2 MG/2ML IJ SOLN
INTRAMUSCULAR | Status: AC
  Administered 2017-05-10: 2 mg via INTRAVENOUS
  Filled 2017-05-10: qty 2

## 2017-05-10 MED ORDER — SODIUM CHLORIDE 0.9 % IV SOLN
0.0000 ug/min | INTRAVENOUS | Status: DC
  Administered 2017-05-10 (×2): 400 ug/min via INTRAVENOUS
  Administered 2017-05-10: 200 ug/min via INTRAVENOUS
  Administered 2017-05-10 (×2): 250 ug/min via INTRAVENOUS
  Administered 2017-05-10: 350 ug/min via INTRAVENOUS
  Administered 2017-05-10: 275 ug/min via INTRAVENOUS
  Administered 2017-05-11 (×8): 400 ug/min via INTRAVENOUS
  Filled 2017-05-10 (×7): qty 40
  Filled 2017-05-10: qty 4
  Filled 2017-05-10: qty 40
  Filled 2017-05-10: qty 4
  Filled 2017-05-10 (×2): qty 40
  Filled 2017-05-10: qty 4
  Filled 2017-05-10 (×2): qty 40
  Filled 2017-05-10: qty 4

## 2017-05-10 MED ORDER — MIDAZOLAM HCL 5 MG/5ML IJ SOLN
INTRAMUSCULAR | Status: AC | PRN
  Administered 2017-05-10: 0.5 mg via INTRAVENOUS

## 2017-05-10 MED ORDER — VANCOMYCIN HCL IN DEXTROSE 1-5 GM/200ML-% IV SOLN
1000.0000 mg | INTRAVENOUS | Status: DC
  Administered 2017-05-10: 1000 mg via INTRAVENOUS
  Filled 2017-05-10: qty 200

## 2017-05-10 MED ORDER — OXYCODONE HCL 5 MG PO TABS: 5.0000 mg | ORAL_TABLET | ORAL | Status: DC | PRN

## 2017-05-10 MED ORDER — ONDANSETRON HCL 4 MG/2ML IJ SOLN
INTRAMUSCULAR | Status: AC
  Filled 2017-05-10: qty 4

## 2017-05-10 MED ORDER — SODIUM CHLORIDE 0.9 % IV BOLUS (SEPSIS)
1000.0000 mL | Freq: Once | INTRAVENOUS | Status: AC
  Administered 2017-05-10: 1000 mL via INTRAVENOUS

## 2017-05-10 MED ORDER — MORPHINE SULFATE (PF) 2 MG/ML IV SOLN: 2.0000 mg | INTRAVENOUS | Status: DC | PRN

## 2017-05-10 MED ORDER — VANCOMYCIN HCL IN DEXTROSE 1-5 GM/200ML-% IV SOLN
1000.0000 mg | Freq: Once | INTRAVENOUS | Status: AC
  Administered 2017-05-10: 1000 mg via INTRAVENOUS
  Filled 2017-05-10: qty 200

## 2017-05-10 MED ORDER — PROMETHAZINE HCL 25 MG/ML IJ SOLN
INTRAMUSCULAR | Status: AC
  Filled 2017-05-10: qty 1

## 2017-05-10 MED ORDER — VANCOMYCIN HCL IN DEXTROSE 1-5 GM/200ML-% IV SOLN
1000.0000 mg | INTRAVENOUS | Status: DC
  Administered 2017-05-11: 1000 mg via INTRAVENOUS
  Filled 2017-05-10 (×2): qty 200

## 2017-05-10 MED ORDER — NOREPINEPHRINE BITARTRATE 1 MG/ML IV SOLN
0.0000 ug/min | INTRAVENOUS | Status: DC
  Administered 2017-05-10: 40 ug/min via INTRAVENOUS
  Administered 2017-05-10: 25 ug/min via INTRAVENOUS
  Administered 2017-05-10: 40 ug/min via INTRAVENOUS
  Administered 2017-05-11 (×4): 70 ug/min via INTRAVENOUS
  Filled 2017-05-10 (×7): qty 16

## 2017-05-10 MED ORDER — FENTANYL BOLUS VIA INFUSION
25.0000 ug | INTRAVENOUS | Status: DC | PRN
  Administered 2017-05-11 (×6): 25 ug via INTRAVENOUS
  Filled 2017-05-10: qty 25

## 2017-05-10 MED ORDER — ETOMIDATE 2 MG/ML IV SOLN
20.0000 mg | INTRAVENOUS | Status: AC
  Administered 2017-05-10: 20 mg via INTRAVENOUS

## 2017-05-10 MED ORDER — SODIUM CHLORIDE 0.9 % IV SOLN
INTRAVENOUS | Status: DC
  Administered 2017-05-10 – 2017-05-11 (×5): via INTRAVENOUS

## 2017-05-10 MED ORDER — ACETAMINOPHEN 325 MG PO TABS: 650.0000 mg | ORAL_TABLET | Freq: Four times a day (QID) | ORAL | Status: DC | PRN

## 2017-05-10 MED ORDER — PROMETHAZINE HCL 25 MG/ML IJ SOLN
6.2500 mg | Freq: Four times a day (QID) | INTRAMUSCULAR | Status: DC | PRN
  Administered 2017-05-10: 12.5 mg via INTRAVENOUS
  Filled 2017-05-10: qty 1

## 2017-05-10 MED ORDER — ACETAMINOPHEN 650 MG RE SUPP
650.0000 mg | Freq: Four times a day (QID) | RECTAL | Status: DC | PRN
  Filled 2017-05-10: qty 1

## 2017-05-10 MED ORDER — SODIUM CHLORIDE 0.9 % IV SOLN
Freq: Once | INTRAVENOUS | Status: AC
  Administered 2017-05-10: 23:00:00 via INTRAVENOUS

## 2017-05-10 MED ORDER — LIDOCAINE HCL (PF) 1 % IJ SOLN
INTRAMUSCULAR | Status: AC
  Filled 2017-05-10: qty 30

## 2017-05-10 MED ORDER — PIPERACILLIN-TAZOBACTAM 3.375 G IVPB: 3.3750 g | Freq: Three times a day (TID) | INTRAVENOUS | Status: DC

## 2017-05-10 MED ORDER — METRONIDAZOLE IN NACL 5-0.79 MG/ML-% IV SOLN
500.0000 mg | Freq: Three times a day (TID) | INTRAVENOUS | Status: DC
  Administered 2017-05-10: 500 mg via INTRAVENOUS
  Filled 2017-05-10 (×3): qty 100

## 2017-05-10 MED ORDER — ONDANSETRON HCL 4 MG PO TABS: 4.0000 mg | ORAL_TABLET | Freq: Four times a day (QID) | ORAL | Status: DC | PRN

## 2017-05-10 MED ORDER — LIDOCAINE HCL 2 % IJ SOLN
5.0000 mL | Freq: Once | INTRAMUSCULAR | Status: AC
  Administered 2017-05-10: 5 mL via INTRADERMAL
  Filled 2017-05-10: qty 10

## 2017-05-10 MED ORDER — LORAZEPAM 2 MG/ML IJ SOLN: 1.0000 mg | INTRAMUSCULAR | Status: DC | PRN

## 2017-05-10 MED ORDER — CIPROFLOXACIN IN D5W 400 MG/200ML IV SOLN
400.0000 mg | Freq: Two times a day (BID) | INTRAVENOUS | Status: DC
  Administered 2017-05-10: 400 mg via INTRAVENOUS
  Filled 2017-05-10 (×2): qty 200

## 2017-05-10 MED ORDER — MIDAZOLAM HCL 5 MG/5ML IJ SOLN
INTRAMUSCULAR | Status: AC
  Filled 2017-05-10: qty 10

## 2017-05-10 MED ORDER — METOCLOPRAMIDE HCL 5 MG/ML IJ SOLN
INTRAMUSCULAR | Status: AC
  Filled 2017-05-10: qty 2

## 2017-05-10 MED ORDER — ONDANSETRON HCL 4 MG/2ML IJ SOLN
INTRAMUSCULAR | Status: AC
  Administered 2017-05-10: 8 mg
  Filled 2017-05-10: qty 4

## 2017-05-10 MED ORDER — DOCUSATE SODIUM 100 MG PO CAPS: 100.0000 mg | ORAL_CAPSULE | Freq: Two times a day (BID) | ORAL | Status: DC

## 2017-05-10 MED ORDER — SODIUM BICARBONATE 8.4 % IV SOLN
200.0000 meq | Freq: Once | INTRAVENOUS | Status: AC
  Administered 2017-05-10: 200 meq via INTRAVENOUS
  Filled 2017-05-10: qty 200

## 2017-05-10 MED ORDER — FENTANYL 2500MCG IN NS 250ML (10MCG/ML) PREMIX INFUSION
25.0000 ug/h | INTRAVENOUS | Status: DC
  Administered 2017-05-10 (×2): 150 ug/h via INTRAVENOUS

## 2017-05-10 MED ORDER — MORPHINE SULFATE (PF) 2 MG/ML IV SOLN
INTRAVENOUS | Status: AC
  Filled 2017-05-10: qty 1

## 2017-05-10 MED ORDER — SODIUM CHLORIDE 0.9% FLUSH
5.0000 mL | Freq: Three times a day (TID) | INTRAVENOUS | Status: DC
  Administered 2017-05-10: 5 mL

## 2017-05-10 MED ORDER — PIPERACILLIN-TAZOBACTAM 3.375 G IVPB 30 MIN
3.3750 g | Freq: Once | INTRAVENOUS | Status: AC
  Administered 2017-05-10: 3.375 g via INTRAVENOUS
  Filled 2017-05-10: qty 50

## 2017-05-10 MED ORDER — LIDOCAINE-EPINEPHRINE (PF) 1 %-1:200000 IJ SOLN
INTRAMUSCULAR | Status: AC
  Filled 2017-05-10: qty 30

## 2017-05-10 MED ORDER — FENTANYL CITRATE (PF) 100 MCG/2ML IJ SOLN
INTRAMUSCULAR | Status: AC
  Filled 2017-05-10: qty 2

## 2017-05-10 MED ORDER — MIDAZOLAM HCL 2 MG/2ML IJ SOLN
INTRAMUSCULAR | Status: AC
  Filled 2017-05-10: qty 2

## 2017-05-10 MED ORDER — FENTANYL CITRATE (PF) 100 MCG/2ML IJ SOLN
INTRAMUSCULAR | Status: AC
  Filled 2017-05-10: qty 4

## 2017-05-10 MED ORDER — EPINEPHRINE PF 1 MG/ML IJ SOLN
0.5000 ug/min | INTRAVENOUS | Status: DC
  Administered 2017-05-10: 0.5 ug/min via INTRAVENOUS
  Filled 2017-05-10: qty 4

## 2017-05-10 MED ORDER — SODIUM CHLORIDE 0.9 % IV BOLUS (SEPSIS): 500.0000 mL | Freq: Once | INTRAVENOUS | Status: DC

## 2017-05-10 MED ORDER — CHLORHEXIDINE GLUCONATE 0.12% ORAL RINSE (MEDLINE KIT)
15.0000 mL | Freq: Two times a day (BID) | OROMUCOSAL | Status: DC
  Administered 2017-05-11: 15 mL via OROMUCOSAL

## 2017-05-10 MED ORDER — VASOPRESSIN 20 UNIT/ML IV SOLN
0.0300 [IU]/min | INTRAVENOUS | Status: DC
  Administered 2017-05-10: 0.04 [IU]/min via INTRAVENOUS
  Administered 2017-05-10: 0.03 [IU]/min via INTRAVENOUS
  Administered 2017-05-11: 0.04 [IU]/min via INTRAVENOUS
  Filled 2017-05-10 (×3): qty 2

## 2017-05-10 MED ORDER — SODIUM CHLORIDE 0.9 % IV SOLN
Freq: Once | INTRAVENOUS | Status: AC
  Administered 2017-05-10: 15:00:00 via INTRAVENOUS

## 2017-05-10 MED ORDER — HEPARIN SODIUM (PORCINE) 5000 UNIT/ML IJ SOLN: 5000.0000 [IU] | Freq: Three times a day (TID) | INTRAMUSCULAR | Status: DC

## 2017-05-10 MED ORDER — ORAL CARE MOUTH RINSE
15.0000 mL | Freq: Four times a day (QID) | OROMUCOSAL | Status: DC
  Administered 2017-05-11: 15 mL via OROMUCOSAL

## 2017-05-10 MED ORDER — ACETAMINOPHEN 650 MG RE SUPP
325.0000 mg | Freq: Four times a day (QID) | RECTAL | Status: DC | PRN
  Administered 2017-05-11: 325 mg via RECTAL
  Filled 2017-05-10: qty 1

## 2017-05-10 MED ORDER — SODIUM CHLORIDE 0.9 % IV SOLN
8.0000 mg | Freq: Once | INTRAVENOUS | Status: AC
  Administered 2017-05-10: 8 mg via INTRAVENOUS

## 2017-05-10 MED ORDER — PROMETHAZINE HCL 25 MG/ML IJ SOLN
12.5000 mg | Freq: Once | INTRAMUSCULAR | Status: AC
  Administered 2017-05-10: 12.5 mg via INTRAVENOUS

## 2017-05-10 MED ORDER — SODIUM BICARBONATE 8.4 % IV SOLN
INTRAVENOUS | Status: DC
  Administered 2017-05-10 – 2017-05-11 (×4): via INTRAVENOUS
  Filled 2017-05-10 (×9): qty 200

## 2017-05-10 MED ORDER — METOCLOPRAMIDE HCL 5 MG/ML IJ SOLN
10.0000 mg | Freq: Once | INTRAMUSCULAR | Status: AC
  Administered 2017-05-10: 10 mg via INTRAVENOUS

## 2017-05-10 MED ORDER — VITAMIN K1 10 MG/ML IJ SOLN
10.0000 mg | Freq: Once | INTRAVENOUS | Status: AC
  Administered 2017-05-10: 10 mg via INTRAVENOUS
  Filled 2017-05-10: qty 1

## 2017-05-10 MED ORDER — MIDAZOLAM HCL 2 MG/2ML IJ SOLN
2.0000 mg | Freq: Once | INTRAMUSCULAR | Status: AC
  Administered 2017-05-10: 2 mg via INTRAVENOUS

## 2017-05-10 MED ORDER — ETOMIDATE 2 MG/ML IV SOLN
INTRAVENOUS | Status: AC
  Administered 2017-05-10: 20 mg via INTRAVENOUS
  Filled 2017-05-10: qty 20

## 2017-05-10 MED ORDER — SODIUM CHLORIDE 0.9 % IV SOLN
0.0000 ug/min | INTRAVENOUS | Status: DC
  Administered 2017-05-10: 300 ug/min via INTRAVENOUS
  Administered 2017-05-10: 20 ug/min via INTRAVENOUS
  Filled 2017-05-10 (×2): qty 1
  Filled 2017-05-10 (×2): qty 10

## 2017-05-10 MED ORDER — LIDOCAINE-EPINEPHRINE (PF) 1 %-1:200000 IJ SOLN
INTRAMUSCULAR | Status: AC | PRN
  Administered 2017-05-10: 10 mL

## 2017-05-10 MED ORDER — MORPHINE SULFATE (PF) 2 MG/ML IV SOLN
1.0000 mg | Freq: Once | INTRAVENOUS | Status: AC
  Administered 2017-05-10: 1 mg via INTRAVENOUS

## 2017-05-10 MED ORDER — ONDANSETRON HCL 4 MG/2ML IJ SOLN
4.0000 mg | Freq: Four times a day (QID) | INTRAMUSCULAR | Status: DC | PRN
  Administered 2017-05-10 (×2): 4 mg via INTRAVENOUS
  Filled 2017-05-10 (×2): qty 2

## 2017-05-10 NOTE — Procedures (Signed)
Central Venous Catheter Insertion Procedure Note William Lara [id number] Oct 08, 1970  Procedure: Insertion of Central Venous Catheter Indications: Assessment of intravascular volume, Drug and/or fluid administration and Frequent blood sampling  Procedure Details Consent: Risks of procedure as well as the alternatives and risks of each were explained to the (patient/caregiver).  Consent for procedure obtained. Time Out: Verified patient identification, verified procedure, site/side was marked, verified correct patient position, special equipment/implants available, medications/allergies/relevent history reviewed, required imaging and test results available.  Performed  Maximum sterile technique was used including antiseptics, cap, gloves, gown, hand hygiene, mask and sheet. Skin prep: Chlorhexidine; local anesthetic administered A antimicrobial bonded/coated triple lumen catheter was placed in the right femoral vein due to emergent situation using the Seldinger technique.  Evaluation Blood flow good Complications: No apparent complications Patient did tolerate procedure well. Chest X-ray ordered to verify placement.  CXR: not indicated .  William Lara, [place] Pager [phone] (please enter 7 digits) PCCM Consult Pager [phone] (please enter 7 digits)

## 2017-05-10 NOTE — Progress Notes (Signed)
Notified Dr. Jefferson Fuel that patient is hypothermic at 93 degrees and bair hugger placed- Lactic acid more elevated.  Pt only having 45ml of output since foley insertion.  Also patient continues to breath 35-40 times a minute.  Though sating 100% room air.  No new orders at this time.

## 2017-05-10 NOTE — Progress Notes (Signed)
Lactic acid level of 6.8 reported to [facility] NP.

## 2017-05-10 NOTE — ED Notes (Signed)
L wrist IV saline locked. Fluid from IV infiltrated into L upper arm. Arm elevated on pillows and 2 blankets

## 2017-05-10 NOTE — Progress Notes (Signed)
Updated Dr. Jefferson Fuel about Lactic acid of 12.3- also that biliary tube seems that it may be malpositioned- Dr. Jefferson Fuel stated that it looks in place from CT scan but GI can still assess when they come by.   No urine output since shift change- Dr. Jefferson Fuel ordered to place foley catheter.

## 2017-05-10 NOTE — Progress Notes (Signed)
The patient is admitted for sepsis this morning. He developed septic shock, on 3 pressor drip. Vital signs reviewed.  Physical examination is done.  The patient is lethargic. A/P  1.    Septic shock:  Continue pressor drip and IV fluid support. Continue broad-spectrum antibiotics.  Follow blood cultures for growth and sensitivities.  Possible source is potentially malfunctioning biliary drain and Hepatic abscess vs superinfected mets.  Follow-up interventional radiologist.  Severe lactic acidosis.  Continue treatment as above.  Follow-up level.  2.  Acute on chronic kidney failure: Prerenal; secondary to sepsis.  Aggressively hydrate with intravenous fluid.  Avoid nephrotoxic agents. 3.  Pancreatic cancer: Metastatic; status post Whipple.  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  He underwent chemotherapy 3 days ago.  Consult oncology if needed before further recommendations regarding plan of care.  I discussed with the patient's father and niece. Time spent about 37 minutes.

## 2017-05-10 NOTE — Progress Notes (Addendum)
Pharmacy Antibiotic Note  William Lara is a 47 y.o. male admitted on 05/05/2017 with sepsis.  Pharmacy has been consulted for vancomycin and meropenem dosing.  Plan: DW 82kg  Vd 57L kei 0.04 hr-1  T1/2 17 hours  Vancomycin 1 gram q 18 hours ordered with stacked dosing. Trough Level before 4th dose. Goal trough 15-20. Will monitor renal function and adjust dose as needed.   Start meropenem 1g IV every 12 hours.   Height: 6\' 4"  (193 cm) Weight: 180 lb (81.6 kg) IBW/kg (Calculated) : 86.8  Temp (24hrs), Avg:100.1 F (37.8 C), Min:98.5 F (36.9 C), Max:101.7 F (38.7 C)  Recent Labs  Lab 05/01/2017 0138 04/20/2017 0341 05/16/2017 0658  WBC 12.8*  --   --   CREATININE 2.53*  --   --   LATICACIDVEN 4.6* 6.8* 12.3*    Estimated Creatinine Clearance: 42.6 mL/min (A) (by C-G formula based on SCr of 2.53 mg/dL (H)).    Allergies  Allergen Reactions  . Iodinated Diagnostic Agents Anaphylaxis, Shortness Of Breath and Other (See Comments)    Patient had a breakthrough reaction to IV CT contrast despite a 13 hour prep.  The patient should not have iodinated CT contrast ever again due to a breakthrough reaction.  . Shellfish Allergy Other (See Comments)    Shellfish-Migraine. Does fine with CT scans.    Antimicrobials this admission: Vancomycin 2/22 >> Meropenem 2/22 >>   Cipro/Flagyl 2/22  x 1 dose  Zosyn 2/22  X 1 dose  Dose adjustments this admission:  Microbiology results: 2/22 BCx: NG TD 2/22 MRSA PCR (-)   2/22 CXR: no active disease 2/52 UCx: pending  Thank you for allowing pharmacy to be a part of this patient's care.  Pernell Dupre, PharmD, BCPS Clinical Pharmacist 04/29/2017 10:56 AM

## 2017-05-10 NOTE — Progress Notes (Signed)
Pharmacy Antibiotic Note  William Lara is a 47 y.o. male admitted on 04/29/2017 with sepsis.  Pharmacy has been consulted for vancomycin and Zosyn dosing.  Plan: DW 82kg  Vd 57L kei 0.04 hr-1  T1/2 17 hours Vancomycin 1 gram q 18 hours ordered with stacked dosing. Level before 5th dose. Goal trough 15-20.  Zosyn 3.375 grams q 8 hours ordered.  Height: 6\' 4"  (193 cm) Weight: 180 lb (81.6 kg) IBW/kg (Calculated) : 86.8  Temp (24hrs), Avg:99.2 F (37.3 C), Min:99.2 F (37.3 C), Max:99.2 F (37.3 C)  Recent Labs  Lab 05/16/2017 0138  WBC 12.8*  CREATININE 2.53*  LATICACIDVEN 4.6*    Estimated Creatinine Clearance: 42.6 mL/min (A) (by C-G formula based on SCr of 2.53 mg/dL (H)).    Allergies  Allergen Reactions  . Iodinated Diagnostic Agents Anaphylaxis, Shortness Of Breath and Other (See Comments)    Patient had a breakthrough reaction to IV CT contrast despite a 13 hour prep.  The patient should not have iodinated CT contrast ever again due to a breakthrough reaction.  . Shellfish Allergy Other (See Comments)    Shellfish-Migraine. Does fine with CT scans.    Antimicrobials this admission: Vancomycin, Zosyn 2/22  >>    >>   Dose adjustments this admission:   Microbiology results: 2/22 BCx: pending      2/22 CXR: no active disease 2/52 UA: pending Thank you for allowing pharmacy to be a part of this patient's care.  Jorita Bohanon S 04/26/2017 3:17 AM

## 2017-05-10 NOTE — Progress Notes (Signed)
   05/06/2017 1430  Clinical Encounter Type  Visited With Patient and family together  Visit Type Follow-up   Chaplain checked in with family as they were leaving patient's room.  Patient being cared for by team; chaplain offered silent prayer.

## 2017-05-10 NOTE — ED Notes (Signed)
IV fluids to pressure bags. Dr Owens Shark at bedside. Pts mother at bedside

## 2017-05-10 NOTE — Progress Notes (Signed)
Pulmonary/critical care attending  Case discussed with both interventional radiology and surgery Dr. Burt Knack.  Also discussed with family who is present at the bedside.  The patient wishes [place] to be aggressive with care.  Interventional radiology feels percutaneous drainage procedure with replacement of the biliary drain and drainage of the abscess on the image guidance.  Patient's INR is 1.79.  Will replace with fresh frozen plasma, also will monitor closely as patient may need to be intubated.  Presently he is awake is communicating responsive stable oxygen saturation and present mean arterial pressures greater than 65.  Hermelinda Dellen, DO

## 2017-05-10 NOTE — Progress Notes (Signed)
PHARMACIST PAIN CONSULT   William Lara is an 47 y.o. male who presented to [facility] on 05/15/2017 with a chief complaint of fever. PMH pancreatic cancer now s/p Whipple and biliary drain placement. Developed fever and presented to [facility] ED. Recent admission for sepsis due to drain blockage. Since replacing drain he has had at least 1.5 L output daily. Sepsis protocol initiated and patient is now on meropenem/vancomycin. Pharmacy pain consult placed. This patient has very recently started taking oral oxycodone outpatient in the setting of pancreatic cancer and drain placement.    Chief Complaint  Patient presents with  . Fever  . Weakness   .   Past Medical History:  Diagnosis Date  . Essential hypertension 05/09/2011  . Hyperthyroidism without crisis 02/16/2016  . Malignant tumor of head of pancreas ([place]) 10/19/2014  . Obstructive sleep apnea 05/09/2011  . Septic shock ([place]) 03/20/2017    Cancer Diagnosis: Yes - pancreatic  Opioid Naive: Essentially - very recently started oxycodone 5 to 10 mg po Q4H PRN pain, filled 10 tablets on 03/28/17 and 120 tablets (30 day supply) 04/26/17  Home Pain Medications: Oxycodone 5 to 10 mg po Q4H PRN pain   Home Morphine Equivalents/24 hours: N/A - PRN  Baseline Pain Score at Home: N/A  Assessment: Unavailable to assess - patient returned from IR procedure ventilated and on fentanyl infusion  Date Pain Score   Pain Goal in Hospital: N/A  Patient's pain is unable to be assessed by pharmacist at this time. Will continue fentanyl infusion as ordered and readdress pain regimen when pharmacist is able to engage with patient    Plan: Continue fentanyl infusion - reassess as able.  Mild:  Moderate:  Severe:  Breakthrough:  Constipation:  Pharmacist will continue to monitor patient's pain scores and bowel movements and adjust therapies as needed.   Laural Benes, Pharm.D., BCPS Clinical Pharmacist  05/10/2017 4:39 PM

## 2017-05-10 NOTE — ED Notes (Signed)
I called lab to clarify labwork sent, was told if there is a problem they will call us

## 2017-05-10 NOTE — H&P (Addendum)
William Lara is an 47 y.o. male.   Chief Complaint: Fever HPI: The patient with past medical history of pancreatic cancer status post Whipple procedure and biliary drain placement presents to the emergency department with fever.  The patient had a recent admission for sepsis at which time the biliary drain was placed as obstruction and backflow of bacteria was the source of sepsis.  Since that time his biliary drain has had at least 1500 cc of output per day.  However, the drain has stopped flowing as of today.  The patient has had worsening weakness with one episode of collapse without impact and another episode during which he fell while being transferred with a EMS.  He complains of right shoulder pain that proceeded his fall today.  There is also report of some seizure-like activity earlier today as well.  Evaluation in the emergency department revealed the patient met criteria for sepsis.  Sepsis protocol was initiated in the emergency department staff called the hospitalist service for admission.  Past Medical History:  Diagnosis Date  . Essential hypertension 05/09/2011  . Hyperthyroidism without crisis 02/16/2016  . Malignant tumor of head of pancreas ([place]) 10/19/2014  . Obstructive sleep apnea 05/09/2011  . Septic shock ([place]) 03/20/2017    Past Surgical History:  Procedure Laterality Date  . BILIARY DRAINAGE    . WHIPPLE PROCEDURE      Family History  Problem Relation Age of Onset  . Colon cancer Maternal Grandmother    Social History:  reports that  has never smoked. he has never used smokeless tobacco. He reports that he drinks alcohol. He reports that he does not use drugs.  Allergies:  Allergies  Allergen Reactions  . Iodinated Diagnostic Agents Anaphylaxis, Shortness Of Breath and Other (See Comments)    Patient had a breakthrough reaction to IV CT contrast despite a 13 hour prep.  The patient should not have iodinated CT contrast ever again due to a breakthrough reaction.   . Shellfish Allergy Other (See Comments)    Shellfish-Migraine. Does fine with CT scans.    Medications Prior to Admission  Medication Sig Dispense Refill  . Multiple Vitamin (MULTIVITAMIN WITH MINERALS) TABS tablet Take 1 tablet by mouth daily.    Marland Kitchen oxyCODONE (OXY IR/ROXICODONE) 5 MG immediate release tablet Take 5-10 mg by mouth every 4 (four) hours as needed for pain.    . Vitamin D, Ergocalciferol, (DRISDOL) 50000 units CAPS capsule Take 50,000 capsules by mouth every Monday.    Marland Kitchen ZENPEP 40000-126000 units CPEP Take 2-3 capsules by mouth 3 (three) times daily. 2 capsules with snacks and 3 capsules with meals      Results for orders placed or performed during the hospital encounter of 04/25/2017 (from the past 48 hour(s))  Comprehensive metabolic panel     Status: Abnormal   Collection Time: 05/09/2017  1:38 AM  Result Value Ref Range   Sodium 131 (L) 135 - 145 mmol/L   Potassium 3.6 3.5 - 5.1 mmol/L   Chloride 103 101 - 111 mmol/L   CO2 15 (L) 22 - 32 mmol/L   Glucose, Bld 113 (H) 65 - 99 mg/dL   BUN 53 (H) 6 - 20 mg/dL   Creatinine, Ser 2.53 (H) 0.61 - 1.24 mg/dL   Calcium 8.0 (L) 8.9 - 10.3 mg/dL   Total Protein 6.2 (L) 6.5 - 8.1 g/dL   Albumin 2.9 (L) 3.5 - 5.0 g/dL   AST 63 (H) 15 - 41 U/L   ALT  58 17 - 63 U/L   Alkaline Phosphatase 96 38 - 126 U/L   Total Bilirubin 4.4 (H) 0.3 - 1.2 mg/dL   GFR calc non Af Amer 29 (L) >60 mL/min   GFR calc Af Amer 34 (L) >60 mL/min    Comment: (NOTE) The eGFR has been calculated using the CKD EPI equation. This calculation has not been validated in all clinical situations. eGFR's persistently <60 mL/min signify possible Chronic Kidney Disease.    Anion gap 13 5 - 15    Comment: Performed at [facility], [place], [place] [address]  Lactic acid, plasma     Status: Abnormal   Collection Time: 04/28/2017  1:38 AM  Result Value Ref Range   Lactic Acid, Venous 4.6 (HH) 0.5 - 1.9 mmol/L    Comment: CRITICAL RESULT  CALLED TO, READ BACK BY AND VERIFIED WITH JENN DALEY RN AT 0511 05/15/2017. MSS Performed at [facility], [address] [place], [place] [address]   CBC with Differential     Status: Abnormal   Collection Time: 04/26/2017  1:38 AM  Result Value Ref Range   WBC 12.8 (H) 3.8 - 10.6 K/uL   RBC 3.30 (L) 4.40 - 5.90 MIL/uL   Hemoglobin 9.8 (L) 13.0 - 18.0 g/dL   HCT 29.7 (L) 40.0 - 52.0 %   MCV 90.1 80.0 - 100.0 fL   MCH 29.8 26.0 - 34.0 pg   MCHC 33.0 32.0 - 36.0 g/dL   RDW 18.6 (H) 11.5 - 14.5 %   Platelets 453 (H) 150 - 440 K/uL   Neutrophils Relative % 92 %   Neutro Abs 11.7 (H) 1.4 - 6.5 K/uL   Lymphocytes Relative 5 %   Lymphs Abs 0.7 (L) 1.0 - 3.6 K/uL   Monocytes Relative 3 %   Monocytes Absolute 0.4 0.2 - 1.0 K/uL   Eosinophils Relative 0 %   Eosinophils Absolute 0.0 0 - 0.7 K/uL   Basophils Relative 0 %   Basophils Absolute 0.0 0 - 0.1 K/uL    Comment: Performed at [facility], [address], [place], [place] [address]  Protime-INR     Status: Abnormal   Collection Time: 05/01/2017  1:38 AM  Result Value Ref Range   Prothrombin Time 20.6 (H) 11.4 - 15.2 seconds   INR 1.79     Comment: Performed at [facility], [address], [place],  [address]  Procalcitonin - Baseline     Status: None   Collection Time: 04/19/2017  1:38 AM  Result Value Ref Range   Procalcitonin 14.06 ng/mL    Comment:        Interpretation: PCT >= 10 ng/mL: Important systemic inflammatory response, almost exclusively due to severe bacterial sepsis or septic shock. (NOTE)       Sepsis PCT Algorithm           Lower Respiratory Tract                                      Infection PCT Algorithm    ----------------------------     ----------------------------         PCT < 0.25 ng/mL                PCT < 0.10 ng/mL         Strongly encourage  Strongly discourage   discontinuation of antibiotics    initiation of antibiotics     ----------------------------     -----------------------------       PCT 0.25 - 0.50 ng/mL            PCT 0.10 - 0.25 ng/mL               OR       >80% decrease in PCT            Discourage initiation of                                            antibiotics      Encourage discontinuation           of antibiotics    ----------------------------     -----------------------------         PCT >= 0.50 ng/mL              PCT 0.26 - 0.50 ng/mL                AND       <80% decrease in PCT             Encourage initiation of                                             antibiotics       Encourage continuation           of antibiotics    ----------------------------     -----------------------------        PCT >= 0.50 ng/mL                  PCT > 0.50 ng/mL               AND         increase in PCT                  Strongly encourage                                      initiation of antibiotics    Strongly encourage escalation           of antibiotics                                     -----------------------------                                           PCT <= 0.25 ng/mL                                                 OR                                        >  80% decrease in PCT                                     Discontinue / Do not initiate                                             antibiotics Performed at [facility], [address] [place], [place] [address]   TSH     Status: None   Collection Time: 04/23/2017  1:38 AM  Result Value Ref Range   TSH 0.684 0.350 - 4.500 uIU/mL    Comment: Performed by a 3rd Generation assay with a functional sensitivity of <=0.01 uIU/mL. Performed at [facility], [place], [place] [id number]    Dg Abd 1 View  Result Date: 04/26/2017 CLINICAL DATA:  47 year old male with abdominal pain. EXAM: ABDOMEN - 1 VIEW COMPARISON:  Chest radiograph dated 05/04/2017 FINDINGS: There is prominence of the colonic fold  which may represent colitis. Clinical correlation is recommended. An air-filled segment of bowel in the left mid abdomen with apparent thickened folds measuring up to 3.6 cm in diameter likely represents a portion of the colon. A mildly dilated segment of small bowel representing enteritis and associated ileus is less likely but not excluded clinical correlation is recommended partially visualized the tail drainage in the right upper quadrant, cholecystectomy clips, and hernia repair mesh noted. No acute osseous pathology IMPRESSION: 1. Thickened appearance of the colonic fold concerning for colitis. Clinical correlation is recommended 2. A segment of bowel measuring 3.6 cm in diameter in the left mid abdomen may represent portion of the colon or small bowel. If there is clinical concern for obstruction further evaluation with small bowel follow-through or CT of the abdomen pelvis is recommended. Electronically Signed   By: Anner Crete M.D.   On: 05/06/2017 05:28   Dg Chest Portable 1 View  Result Date: 05/01/2017 CLINICAL DATA:  Weakness and fever EXAM: PORTABLE CHEST 1 VIEW COMPARISON:  03/21/2017 FINDINGS: The heart size and mediastinal contours are within normal limits. Both lungs are clear. The visualized skeletal structures are unremarkable. IMPRESSION: No active disease. Electronically Signed   By: Donavan Foil M.D.   On: 04/20/2017 02:19    Review of Systems  Constitutional: Positive for chills and fever.  HENT: Negative for sore throat and tinnitus.   Eyes: Negative for blurred vision and redness.  Respiratory: Negative for cough and shortness of breath.   Cardiovascular: Negative for chest pain, palpitations, orthopnea and PND.  Gastrointestinal: Positive for nausea and vomiting. Negative for abdominal pain and diarrhea.  Genitourinary: Negative for dysuria, frequency and urgency.  Musculoskeletal: Negative for joint pain and myalgias.  Skin: Negative for rash.       No lesions   Neurological: Negative for speech change, focal weakness and weakness.  Endo/Heme/Allergies: Does not bruise/bleed easily.       No temperature intolerance  Psychiatric/Behavioral: Negative for depression and suicidal ideas.    Blood pressure (!) 91/52, pulse (!) 125, temperature 99.2 F (37.3 C), temperature source Oral, resp. rate (!) 27, height 6' 4"  (1.93 m), weight 81.6 kg (180 lb), SpO2 96 %. Physical Exam  Vitals reviewed. Constitutional: He is oriented to person, place, and time. He appears well-developed and well-nourished. No distress.  HENT:  Head: Normocephalic and atraumatic.  Mouth/Throat: Oropharynx is clear and moist.  Eyes: Conjunctivae and EOM are normal. Pupils are equal, round, and reactive to light. No scleral icterus.  Neck: Normal range of motion. Neck supple. No JVD present. No tracheal deviation present. No thyromegaly present.  Cardiovascular: Regular rhythm and normal heart sounds. Tachycardia present. Exam reveals no gallop and no friction rub.  No murmur heard. Respiratory: Effort normal and breath sounds normal. Tachypnea noted. No respiratory distress.  GI: Soft. Bowel sounds are normal. He exhibits no distension. There is no tenderness.  Genitourinary:  Genitourinary Comments: Deferred  Musculoskeletal: Normal range of motion. He exhibits no edema.  Lymphadenopathy:    He has no cervical adenopathy.  Neurological: He is alert and oriented to person, place, and time. No cranial nerve deficit.  Skin: Skin is warm and dry. No rash noted. No erythema.  Psychiatric: He has a normal mood and affect. His behavior is normal. Judgment and thought content normal.     Assessment/Plan This is a 47 year old male admitted for sepsis. 1.  Sepsis: The patient meets criteria via history of fever, tachycardia, tachypnea and leukocytosis.  His blood pressure is soft but he is not yet fully fluid resuscitated.  Continue to expand intravascular volume.  Continue  broad-spectrum antibiotics.  Follow blood cultures for growth and sensitivities.  Possible source is potentially malfunctioning biliary drain.  I have consulted surgery to assess this issue especially as imaging may be difficult due to anaphylactic reaction to dye. 2.  Acute on chronic kidney failure: Prerenal; secondary to sepsis.  Aggressively hydrate with intravenous fluid.  Avoid nephrotoxic agents. 3.  Pancreatic cancer: Metastatic; status post Whipple.  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  He underwent chemotherapy 3 days ago.  Consult oncology if needed before further recommendations regarding plan of care. 4.  DVT prophylaxis: Heparin 5.  GI prophylaxis: None The patient is a full code.  Time spent on admission orders and critical care approximately 45 minutes.  Discussed with [facility] telemedicine  Harrie Foreman, MD 05/15/2017, 5:47 AM

## 2017-05-10 NOTE — ED Notes (Signed)
Vomiting mod amt clear brown liquid (200cc)

## 2017-05-10 NOTE — Progress Notes (Signed)
Notified Dr. Jefferson Fuel that since the patient and I have been down for CT and procedures that patient is more lethargic, continues to breath 35-40 times a minute/having work of breathing though saturation is 100%. Patient has had only 3ml of urine output for shift- Dr. Jefferson Fuel ordered for patient to be intubated and per family- to monitor patient for next 24 hrs- No CRRT.

## 2017-05-10 NOTE — Procedures (Signed)
Pre procedural Dx: Obstructive Juandice Post procedural Dx: Same  Successful fluoro guided exchange and upsizing of now 12 Fr biliary drainage catheter with end coiled and locked within the duodenum.   Biliary drain connected to gravity bag.  EBL: None  Complications: None immediate  Ronny Bacon, MD Pager #: [phone]

## 2017-05-10 NOTE — Progress Notes (Signed)
Chief Complaint: Patient was seen in consultation today for hepatic abscesses at the request of Dr. Hermelinda Dellen  Referring Physician(s): Dr. Hermelinda Dellen  Supervising Physician: Sandi Mariscal  Patient Status: [place] - In-pt  History of Present Illness: William Lara is a 47 y.o. male with hx of pancreatic cancer. He has undergone previous Whipple procedure at [facility]. More recently has developed hepatic lesions concerning for mets and obstructive jaundice. Underwent PTC with biliary drainage at [facility] on 1/9 as well as biopsy of the hepatic lesions on 1/23. The pathology was not malignant with those. He has now been admitted with sepsis picture here at [facility]. Imaging shows the hepatic lesions now more suspicious for superinfected lesions vs abscesses. In addition, his bilirubin has risen to 4.4 and he has had less output from his drain. After discussion with the medical and surgical teams, IR is asked to perc drain the abscesses and eval the biliary drain. Pt family at bedside  Past Medical History:  Diagnosis Date  . Essential hypertension 05/09/2011  . Hyperthyroidism without crisis 02/16/2016  . Malignant tumor of head of pancreas ([place]) 10/19/2014  . Obstructive sleep apnea 05/09/2011  . Septic shock ([place]) 03/20/2017    Past Surgical History:  Procedure Laterality Date  . BILIARY DRAINAGE    . WHIPPLE PROCEDURE      Allergies: Iodinated diagnostic agents and Shellfish allergy  Medications:  Current Facility-Administered Medications:  .  0.9 %  sodium chloride infusion, , Intravenous, Continuous, Harrie Foreman, MD, Last Rate: 150 mL/hr at 05/09/2017 0806 .  0.9 %  sodium chloride infusion, , Intravenous, Once, Conforti, John, DO .  docusate sodium (COLACE) capsule 100 mg, 100 mg, Oral, BID, Harrie Foreman, MD .  heparin injection 5,000 Units, 5,000 Units, Subcutaneous, Q8H, Harrie Foreman, MD, Stopped at 04/28/2017 1220 .  LORazepam (ATIVAN) injection 1-2 mg,  1-2 mg, Intravenous, Q4H PRN, Awilda Bill, NP .  MEDLINE mouth rinse, 15 mL, Mouth Rinse, BID, Conforti, John, DO, 15 mL at 05/13/2017 0854 .  meropenem (MERREM) 1 g in sodium chloride 0.9 % 100 mL IVPB, 1 g, Intravenous, Q12H, Hallaji, Sheema M, RPH .  norepinephrine (LEVOPHED) 16 mg in dextrose 5 % 250 mL (0.064 mg/mL) infusion, 0-40 mcg/min, Intravenous, Titrated, Conforti, John, DO, Last Rate: 23.4 mL/hr at 04/25/2017 0833, 25 mcg/min at 05/16/2017 0833 .  ondansetron (ZOFRAN) tablet 4 mg, 4 mg, Oral, Q6H PRN **OR** ondansetron (ZOFRAN) injection 4 mg, 4 mg, Intravenous, Q6H PRN, Harrie Foreman, MD, 4 mg at 05/14/2017 0516 .  oxyCODONE (Oxy IR/ROXICODONE) immediate release tablet 5-10 mg, 5-10 mg, Oral, Q4H PRN, Awilda Bill, NP .  phenylephrine (NEO-SYNEPHRINE) 40 mg in sodium chloride 0.9 % 250 mL (0.16 mg/mL) infusion, 0-400 mcg/min, Intravenous, Titrated, Blakeney, Dreama Saa, NP, Last Rate: 93.8 mL/hr at 04/21/2017 1343, 250 mcg/min at 05/01/2017 1343 .  promethazine (PHENERGAN) injection 6.25-12.5 mg, 6.25-12.5 mg, Intravenous, Q6H PRN, Awilda Bill, NP, 12.5 mg at 05/02/2017 1102 .  sodium chloride 0.9 % bolus 500 mL, 500 mL, Intravenous, Once, Awilda Bill, NP .  Derrill Memo ON 05/22/2017] vancomycin (VANCOCIN) IVPB 1000 mg/200 mL premix, 1,000 mg, Intravenous, Q18H, Hallaji, Sheema M, RPH .  vasopressin (PITRESSIN) 40 Units in sodium chloride 0.9 % 250 mL (0.16 Units/mL) infusion, 0.03 Units/min, Intravenous, Continuous, Blakeney, Dreama Saa, NP, Last Rate: 11.3 mL/hr at 05/09/2017 0806, 0.03 Units/min at 04/24/2017 6789    Family History  Problem Relation Age of Onset  .  Colon cancer Maternal Grandmother     Social History   Socioeconomic History  . Marital status: Single    Spouse name: None  . Number of children: None  . Years of education: None  . Highest education level: None  Social Needs  . Financial resource strain: None  . Food insecurity - worry: None  . Food insecurity -  inability: None  . Transportation needs - medical: None  . Transportation needs - non-medical: None  Occupational History  . None  Tobacco Use  . Smoking status: Never Smoker  . Smokeless tobacco: Never Used  Substance and Sexual Activity  . Alcohol use: Yes    Alcohol/week: 0.0 - 0.6 oz    Comment: occasional  . Drug use: No  . Sexual activity: None  Other Topics Concern  . None  Social History Narrative  . None    Review of Systems: A 12 point ROS discussed and pertinent positives are indicated in the HPI above.  All other systems are negative.  Review of Systems  Vital Signs: BP 119/64   Pulse (!) 114   Temp 98.5 F (36.9 C) (Oral)   Resp (!) 36   Ht 6' 4"  (1.93 m)   Wt 180 lb (81.6 kg)   SpO2 100%   BMI 21.91 kg/m   Physical Exam  Constitutional: He appears well-developed. No distress.  HENT:  Head: Normocephalic.  Mouth/Throat: Oropharynx is clear and moist.  Neck: No JVD present.  Cardiovascular: Normal rate, regular rhythm and normal heart sounds.  Pulmonary/Chest: Effort normal and breath sounds normal. No respiratory distress.  Abdominal: Soft. There is no tenderness.  RUQ biliary drain intact, site clean. Dark blood tinged/rust colored bile in bag/tubing.  Skin: Skin is warm and dry.     Imaging: Ct Abdomen Pelvis Wo Contrast  Result Date: 05/01/2017 CLINICAL DATA:  Pancreatic cancer, status post Whipple and biliary drain placement, fever. Sepsis. EXAM: CT ABDOMEN AND PELVIS WITHOUT CONTRAST TECHNIQUE: Multidetector CT imaging of the abdomen and pelvis was performed following the standard protocol without IV contrast. COMPARISON:  MRI abdomen dated 03/21/2017 FINDINGS: Lower chest: Lung bases are clear. Hepatobiliary: 4.9 x 3.1 x 4.1 cm fluid and gas collection in segment 8 of the liver (series 2/image 16), worrisome for hepatic abscess. Additional 2.4 x 1.3 x 3.0 cm fluid/gas collection in segment 4A (series 2/image 20), worrisome for additional  abscess. Additional smaller lesions are suspected (for example, series 2/image 33), better visualized on prior MRI. Status post cholecystectomy. Indwelling internal/external biliary drain. Pneumobilia. Pancreas: Status post Whipple with proximal pancreatectomy. Spleen: Within normal limits. Adrenals/Urinary Tract: Adrenal glands are within normal limits. 2 mm nonobstructing left lower pole renal calculus (series 2/image 45). Right kidney is within normal limits. No hydronephrosis. Bladder is within normal limits. Stomach/Bowel: Postsurgical changes related to partial gastrectomy with gastrojejunostomy. No evidence of bowel obstruction. Appendix is not discretely visualized. Vascular/Lymphatic: No evidence of abdominal aortic aneurysm. Atherosclerotic calcifications of the abdominal aorta and branch vessels. Upper abdominal nodal soft tissue measuring up to 1.8 cm short axis (series 2/image 29), worrisome for nodal metastases. Additional abnormal soft tissue abutting the celiac axis/SMA (series 2/image 34), poorly evaluated on unenhanced CT, worrisome for recurrence. Small retroperitoneal and bilateral pelvic lymph nodes, including a 10 mm short axis left external iliac node. Reproductive: Prostate is grossly unremarkable. Other: No abdominopelvic ascites. Postsurgical changes related to anterior abdominal wall ventral hernia repair. Musculoskeletal: Degenerative changes of the visualized thoracolumbar spine. IMPRESSION: Progression of hepatic lesions  from prior MRI, measuring up to 4.9 cm in the right hepatic lobe, worrisome for hepatic abscesses given gas within the lesions. Superinfection of hepatic metastases are also possible. Abnormal soft tissue abutting the celiac axis/SMA, poorly evaluated on unenhanced CT, worrisome for recurrence. Upper abdominal nodal metastases measuring up to 1.8 cm short axis. Additional small retroperitoneal/pelvic nodes measuring up to 10 mm. Additional ancillary findings as above.  Electronically Signed   By: Julian Hy M.D.   On: 04/25/2017 08:03   Dg Abd 1 View  Result Date: 05/02/2017 CLINICAL DATA:  47 year old male with abdominal pain. EXAM: ABDOMEN - 1 VIEW COMPARISON:  Chest radiograph dated 04/22/2017 FINDINGS: There is prominence of the colonic fold which may represent colitis. Clinical correlation is recommended. An air-filled segment of bowel in the left mid abdomen with apparent thickened folds measuring up to 3.6 cm in diameter likely represents a portion of the colon. A mildly dilated segment of small bowel representing enteritis and associated ileus is less likely but not excluded clinical correlation is recommended partially visualized the tail drainage in the right upper quadrant, cholecystectomy clips, and hernia repair mesh noted. No acute osseous pathology IMPRESSION: 1. Thickened appearance of the colonic fold concerning for colitis. Clinical correlation is recommended 2. A segment of bowel measuring 3.6 cm in diameter in the left mid abdomen may represent portion of the colon or small bowel. If there is clinical concern for obstruction further evaluation with small bowel follow-through or CT of the abdomen pelvis is recommended. Electronically Signed   By: Anner Crete M.D.   On: 05/14/2017 05:28   Dg Chest Portable 1 View  Result Date: 05/05/2017 CLINICAL DATA:  Weakness and fever EXAM: PORTABLE CHEST 1 VIEW COMPARISON:  03/21/2017 FINDINGS: The heart size and mediastinal contours are within normal limits. Both lungs are clear. The visualized skeletal structures are unremarkable. IMPRESSION: No active disease. Electronically Signed   By: Donavan Foil M.D.   On: 05/07/2017 02:19    Labs:  CBC: Recent Labs    03/20/17 1550 03/21/17 0304 03/22/17 0531 05/12/2017 0138  WBC 4.6 16.9* 11.7* 12.8*  HGB 14.4 11.6* 10.4* 9.8*  HCT 42.7 35.4* 31.3* 29.7*  PLT 192 143* 128* 453*    COAGS: Recent Labs    05/02/2017 0138  INR 1.79     BMP: Recent Labs    03/20/17 1550 03/21/17 0304 03/22/17 0531 04/23/2017 0138  NA 129* 132* 132* 131*  K 4.3 4.0 3.5 3.6  CL 95* 103 105 103  CO2 20* 18* 21* 15*  GLUCOSE 113* 150* 112* 113*  BUN 19 25* 38* 53*  CALCIUM 8.3* 7.1* 7.4* 8.0*  CREATININE 1.37* 2.02* 2.27* 2.53*  GFRNONAA >60 38* 33* 29*  GFRAA >60 44* 38* 34*    LIVER FUNCTION TESTS: Recent Labs    03/20/17 1550 03/22/17 0531 04/21/2017 0138  BILITOT 1.4* 2.3* 4.4*  AST 93* 169* 63*  ALT 104* 132* 58  ALKPHOS 160* 121 96  PROT 7.3 4.8* 6.2*  ALBUMIN 3.2* 1.9* 2.9*    TUMOR MARKERS: No results for input(s): AFPTM, CEA, CA199, CHROMGRNA in the last 8760 hours.  Assessment and Plan: Sepsis Hepatic abscess vs superinfected mets(though prior path negative) Biliary obstruction with rising Bili. Plan for perc drainage of hepatic abscesses as well as cholangiogram with exchange of biliary drain. Labs reviewed. Elevated INR at 1.79 likely secondary to intrisic liver disease. Pt to receive FFP prior to procedure. Risks and benefits discussed with the patient including bleeding,  infection, damage to adjacent structures, bowel perforation/fistula connection, and sepsis.  All of the patient's questions were answered, patient is agreeable to proceed. Consent signed and in chart.    Thank you for this interesting consult.  I greatly enjoyed meeting William Lara and look forward to participating in their care.  A copy of this report was sent to the requesting provider on this date.  Electronically Signed: Ascencion Dike, PA-C 04/30/2017, 1:47 PM   I spent a total of 30 minutes in face to face in clinical consultation, greater than 50% of which was counseling/coordinating care for hepatic abscesses and biliary obstruction

## 2017-05-10 NOTE — ED Notes (Signed)
CODE SEPSIS 

## 2017-05-10 NOTE — Progress Notes (Signed)
   04/23/2017 1146  Clinical Encounter Type  Visited With Patient and family together  Visit Type Follow-up  Spiritual Encounters  Spiritual Needs Emotional   Chaplain Carver updated on-call chaplain on his engagement with patient/family.  Chaplain sat with patient and family offering emotional and spiritual support.  Open to ongoing follow up.

## 2017-05-10 NOTE — Progress Notes (Signed)
Pulmonary/critical care attending  Patient's respiratory status deteriorating, mental status has deteriorated and oxygen saturations dropping.  Increased respiratory distress with work of breathing.  Discussed with family and they states that if his is wishes to proceed with life supportive mechanical ventilation.  Procedure note Intubation Indication: Progressive respiratory failure Medications given: Etomidate 20 mg Using a glide scope a #7.5 endotracheal tube was passed under direct visualization End-tidal CO2 noted Condensation and endotracheal tube Bilateral breath sounds appreciated Tube adjusted at 25 at the lip Stat portable chest x-ray reveals endotracheal tube in place without any evidence of complication We will start sedation with fentanyl and propofol  Further discussion with family regarding patient's renal failure.  Related to them that he has septic shock with multisystem organ failure, hepatic abscess with possible progressive metastatic pancreatic cancer.  With his severe hypotension on 3 pressors he will require CRRT.  Family states that they want to give him 24 hours on mechanical ventilation and not to perform CRRT at this time.  We will check arterial blood gas and try to temporize overnight.  Hermelinda Dellen, DO

## 2017-05-10 NOTE — ED Notes (Signed)
Date and time results received: 04/20/2017 0245 (use smartphrase ".now" to insert current time)  Test: lactic Critical Value: 4.6  Name of Provider Notified: Marcille Blanco  Orders Received? Or Actions Taken?: admission

## 2017-05-10 NOTE — Progress Notes (Signed)
CODE SEPSIS - PHARMACY COMMUNICATION  **Broad Spectrum Antibiotics should be administered within 1 hour of Sepsis diagnosis**  Time Code Sepsis Called/Page Received: 02/22 0147  Antibiotics Ordered: 2/22 0150 vanc/Zosyn  Time of 1st antibiotic administration: 2/2 0158  Additional action taken by pharmacy:   If necessary, Name of Provider/Nurse Contacted:     Eloise Harman ,PharmD Clinical Pharmacist  04/19/2017  2:00 AM

## 2017-05-10 NOTE — Progress Notes (Signed)
Bladder scan showed 60ml.

## 2017-05-10 NOTE — ED Triage Notes (Signed)
Per EMS called out for seizure but pt not post ictial. Very weak, tired, fever. Had chemo on Tues. Hx Pancreatic ca presently undergoing treatmet. Had a Whipple procedure 2 years ago

## 2017-05-10 NOTE — Progress Notes (Signed)
Flagyl and Cipro not given due to limited access. Charli informed in report needs given as soon as central line is placed.

## 2017-05-10 NOTE — ED Provider Notes (Signed)
[facility] Emergency Department Provider Note __   First MD Initiated Contact with Patient 05/06/2017 [phone]     (approximate)  I have reviewed the triage vital signs and the nursing notes.   HISTORY  Chief Complaint Fever and Weakness    HPI William Lara is a 47 y.o. male with below list of chronic medical conditions including pancreatic cancer and septic shock presents to the emergency department via EMS with witnessed seizure-like activity at home followed by no postictal state.  Patient admits to falls without injury.  Patient admits to multiple episodes of vomiting.  Patient also admits to generalized weakness and fatigue.  He was 05/07/2017 EMS states that the patient febrile on arrival was temperature 100.9 hypotensive and tachycardic.   Past Medical History:  Diagnosis Date  . Essential hypertension 05/09/2011  . Hyperthyroidism without crisis 02/16/2016  . Malignant tumor of head of pancreas ([place]) 10/19/2014  . Obstructive sleep apnea 05/09/2011  . Septic shock ([place]) 03/20/2017    Patient Active Problem List   Diagnosis Date Noted  . Sepsis ([place]) 05/03/2017  . Septic shock ([place]) 03/20/2017  . ADHD (attention deficit hyperactivity disorder) 02/04/2017  . Hyperthyroidism without crisis 02/16/2016  . Chronic anticoagulation 01/17/2016  . Pulmonary embolism ([place]) 10/06/2015  . Complete tear of left rotator cuff 09/06/2015  . C. difficile colitis 04/01/2015  . Malignant tumor of head of pancreas ([place]) 10/19/2014  . Obstructive jaundice 09/28/2014  . Essential hypertension 05/09/2011  . Obstructive sleep apnea 05/09/2011    Past surgical history Whipple procedure  Prior to Admission medications   Not on File    Allergies Iodinated diagnostic agents and Shellfish allergy  No family history on file.  Social History Social History   Tobacco Use  . Smoking status: Never Smoker  . Smokeless tobacco: Never Used  Substance Use Topics    . Alcohol use: Yes    Alcohol/week: 0.0 - 0.6 oz    Comment: occasional  . Drug use: No    Review of Systems Constitutional: Positive for fever Eyes: No visual changes. ENT: No sore throat. Cardiovascular: Denies chest pain. Respiratory: Denies shortness of breath. Gastrointestinal: No abdominal pain.  Positive for vomiting.  No diarrhea.  No constipation. Genitourinary: Negative for dysuria. Musculoskeletal: Negative for neck pain.  Negative for back pain. Integumentary: Negative for rash. Neurological: Negative for headaches, focal weakness or numbness.  Positive for generalized weakness   ____________________________________________   PHYSICAL EXAM:  VITAL SIGNS: ED Triage Vitals  Enc Vitals Group     BP 05/05/2017 0150 (!) 81/55     Pulse Rate 05/03/2017 0140 (!) 133     Resp 05/16/2017 0140 (!) 29     Temp 05/16/2017 0213 99.2 F (37.3 C)     Temp Source 05/04/2017 0213 Oral     SpO2 05/09/2017 0140 99 %     Weight 05/08/2017 0141 81.6 kg (180 lb)     Height 05/07/2017 0141 1.93 m (6\' 4" )     Head Circumference --      Peak Flow --      Pain Score --      Pain Loc --      Pain Edu? --      Excl. in [place]? --     Constitutional: Alert and oriented.  Ill-appearing and vomiting  Eyes: Conjunctivae are normal. Head: Atraumatic. Mouth/Throat: Mucous membranes are dry. Oropharynx non-erythematous. Neck: No stridor.   Cardiovascular: Tachycardia regular rhythm. Good peripheral circulation. Grossly  normal heart sounds. Respiratory: Normal respiratory effort.  No retractions. Lungs CTAB. Gastrointestinal: Soft and nontender. No distention.  Bilious output noted in biliary drain with scant bleeding Musculoskeletal: No lower extremity tenderness nor edema. No gross deformities of extremities. Neurologic:  Normal speech and language. No gross focal neurologic deficits are appreciated.  Skin:  Skin is warm, dry and intact. No rash noted.  ____________________________________________    LABS (all labs ordered are listed, but only abnormal results are displayed)  Labs Reviewed  COMPREHENSIVE METABOLIC PANEL - Abnormal; Notable for the following components:      Result Value   Sodium 131 (*)    CO2 15 (*)    Glucose, Bld 113 (*)    BUN 53 (*)    Creatinine, Ser 2.53 (*)    Calcium 8.0 (*)    Total Protein 6.2 (*)    Albumin 2.9 (*)    AST 63 (*)    Total Bilirubin 4.4 (*)    GFR calc non Af Amer 29 (*)    GFR calc Af Amer 34 (*)    All other components within normal limits  CBC WITH DIFFERENTIAL/PLATELET - Abnormal; Notable for the following components:   WBC 12.8 (*)    RBC 3.30 (*)    Hemoglobin 9.8 (*)    HCT 29.7 (*)    RDW 18.6 (*)    Platelets 453 (*)    Neutro Abs 11.7 (*)    Lymphs Abs 0.7 (*)    All other components within normal limits  CULTURE, BLOOD (ROUTINE X 2)  CULTURE, BLOOD (ROUTINE X 2)  LACTIC ACID, PLASMA  LACTIC ACID, PLASMA  PROTIME-INR  URINALYSIS, COMPLETE (UACMP) WITH MICROSCOPIC   ____________________________________________  EKG  ED ECG REPORT I, C[place] N BROWN, the attending physician, personally viewed and interpreted this ECG.   Date: 04/29/2017  EKG Time: 1:39 AM  Rate: 134  Rhythm: Sinus tachycardia  Axis: Normal  intervals: Normal  ST&T Change: None  ____________________________________________  RADIOLOGY I,  N BROWN, personally viewed and evaluated these images (plain radiographs) as part of my medical decision making, as well as reviewing the written report by the radiologist  ED MD interpretation: No acute cardiopulmonary findings  Official radiology report(s): Dg Chest Portable 1 View  Result Date: 05/14/2017 CLINICAL DATA:  Weakness and fever EXAM: PORTABLE CHEST 1 VIEW COMPARISON:  03/21/2017 FINDINGS: The heart size and mediastinal contours are within normal limits. Both lungs are clear. The visualized skeletal structures are unremarkable. IMPRESSION: No active disease. Electronically  Signed   By: Donavan Foil M.D.   On: 05/06/2017 02:19    ____________________________________________   PROCEDURES  Critical Care performed: CRITICAL CARE Performed by: Gregor Hams     .Critical Care Performed by: Gregor Hams, MD Authorized by: Gregor Hams, MD   Critical care provider statement:    Critical care time (minutes):  60   Critical care start time:  05/14/2017 1:46 AM   Critical care end time:  04/20/2017 3:00 AM   Critical care time was exclusive of:  Separately billable procedures and treating other patients   Critical care was necessary to treat or prevent imminent or life-threatening deterioration of the following conditions:  Cardiac failure   Critical care was time spent personally by me on the following activities:  Development of treatment plan with patient or surrogate, discussions with consultants, evaluation of patient's response to treatment, examination of patient, interpretation of cardiac output measurements, re-evaluation of patient's condition, ordering  and review of radiographic studies, ordering and review of laboratory studies and ordering and performing treatments and interventions   I assumed direction of critical care for this patient from another provider in my specialty: yes       ____________________________________________   INITIAL IMPRESSION / ASSESSMENT AND PLAN / ED COURSE  As part of my medical decision making, I reviewed the following data within the electronic MEDICAL RECORD NUMBER  47 year old male presenting with above-stated history and physical exam meeting criteria for sepsis and as such sepsis protocol was initiated patient received 5 L of IV normal saline while in the emergency part improvement of blood pressure and heart rate.  In addition patient received IV vancomycin and Zosyn.  Regarding the patient's vomiting patient received 16 mg, of Zofran, Phenergan 12.5 mg as well as 10 mg Reglan.  Patient discussed with Dr.  Marcille Blanco for hospital admission for septic shock  ____________________________________________  FINAL CLINICAL IMPRESSION(S) / ED DIAGNOSES  Septic shock  MEDICATIONS GIVEN DURING THIS VISIT:  Medications  vancomycin (VANCOCIN) IVPB 1000 mg/200 mL premix (1,000 mg Intravenous New Bag/Given 05/15/2017 0214)  ondansetron (ZOFRAN) 4 MG/2ML injection (  Not Given 04/22/2017 0220)  promethazine (PHENERGAN) 25 MG/ML injection (not administered)  ondansetron (ZOFRAN) 4 MG/2ML injection (8 mg  Given 05/09/2017 0146)  piperacillin-tazobactam (ZOSYN) IVPB 3.375 g (0 g Intravenous Stopped 05/15/2017 0230)  ondansetron (ZOFRAN) 8 mg in sodium chloride 0.9 % 50 mL IVPB (0 mg Intravenous Stopped 04/28/2017 0230)  promethazine (PHENERGAN) injection 12.5 mg (12.5 mg Intravenous Given 05/15/2017 0157)  sodium chloride 0.9 % bolus 1,000 mL (1,000 mLs Intravenous New Bag/Given 05/12/2017 0219)     ED Discharge Orders    None       Note:  This document was prepared using Dragon voice recognition software and may include unintentional dictation errors.    Gregor Hams, MD 05/10/17 [phone]

## 2017-05-10 NOTE — Procedures (Signed)
Pre procedural Dx: hepatic abscesses Post procedural Dx: Same  Technically successful [place] and CT guided placed of a 10 Fr drainage catheter placement into the dominant abscess within the right lobe of the liver yielding 25 cc of red tinged purulent appearing fluid.    Technically successful [place] and CT guided placed of an 8 Fr drainage catheter placement into the smaller abscess within the peripheral aspect of the right lobe of the liver yielding 5 cc of purulent appearing fluid.  All aspirated samples sent to the laboratory for analysis.    EBL: None  Complications: None immediate  Ronny Bacon, MD Pager #: [phone]

## 2017-05-10 NOTE — Progress Notes (Signed)
   05/04/2017 1055  Clinical Encounter Type  Visited With Patient not available  Visit Type Initial  Spiritual Encounters  Spiritual Needs Prayer   Chaplain attempted visit, care team working with patient; chaplain offered silent prayer.

## 2017-05-10 NOTE — Consult Note (Addendum)
Name: William Lara MRN: [id number] DOB: Aug 14, 1970    ADMISSION DATE:  05/07/2017 CONSULTATION DATE: 05/04/2017  REFERRING MD : Dr. Marcille Blanco   CHIEF COMPLAINT: Seizure Activity   BRIEF PATIENT DESCRIPTION:  47 yo male admitted with seizure like activity and septic shock of unknown etiology   SIGNIFICANT EVENTS  02/22-Pt admitted to stepdown unit   STUDIES:  None   HISTORY OF PRESENT ILLNESS:   This is a 47 yo male with a PMH of OSA, Pancreatic Cancer  s/p Whipple Procedure (03/25/2015 at [facility]), Dumping Syndrome, HTN, PE on chronic anticoagulation and Former Smoker.  Recently admitted at [facility] and transferred to [facility] 03/22/17 with Klebsiella bacteremia in the setting of possible recurrent pancreatic cancer with mets.  He was found to have a biliary obstruction 03/24/17, therefore biliary drain placed on 03/26/17.  On 04/10/17 underwent US guided liver biopsy findings consistent with bile leak no malignancy identified.  During his most recent visit with his oncologist at [facility] on 04/22/17 recommended starting chemotherapy due to high likelihood of pancreatic cancer recurrence due to clinical decline, although unable to biopsy suspicious shadow due to vessel involvement to prove recurrence of metastatic pancreatic cancer. On 02/22 the pt presented to [facility] ER via EMS with seizure like activity (pt not postictal), generalized weakness/fatigue, and fever.  He also had 2 falls without injury on 04/19/2017. He recently had chemotherapy on 05/07/17, he has had a total of 2 chemo sessions.  According to pts mother typically on the 3rd day following chemotherapy he develops fatigue, nausea, and vomiting.  In the ER he had multiple episodes of emesis and hypotension.  Lab results revealed Na+ 131, BUN 53, creatinine 2.53, AST 63, lactic acid 4.6, wbc 12.8, hgb 9.8, and CXR negative.  Sepsis protocol initiated he received iv abx and 4L NS bolus.  He was subsequently admitted to the stepdown unit by hospitalist  team for further workup and treatment.    PAST MEDICAL HISTORY :   has a past medical history of Essential hypertension (05/09/2011), Hyperthyroidism without crisis (02/16/2016), Malignant tumor of head of pancreas ([place]) (10/19/2014), Obstructive sleep apnea (05/09/2011), and Septic shock ([place]) (03/20/2017).  has no past surgical history on file. Prior to Admission medications   Medication Sig Start Date End Date Taking? Authorizing Provider  Multiple Vitamin (MULTIVITAMIN WITH MINERALS) TABS tablet Take 1 tablet by mouth daily.   Yes [provider]  oxyCODONE (OXY IR/ROXICODONE) 5 MG immediate release tablet Take 5-10 mg by mouth every 4 (four) hours as needed for pain.   Yes [provider]  Vitamin D, Ergocalciferol, (DRISDOL) 50000 units CAPS capsule Take 50,000 capsules by mouth every Monday.   Yes [provider]  ZENPEP 40000-126000 units CPEP Take 2-3 capsules by mouth 3 (three) times daily. 2 capsules with snacks and 3 capsules with meals   Yes [provider]   Allergies  Allergen Reactions  . Iodinated Diagnostic Agents Anaphylaxis, Shortness Of Breath and Other (See Comments)    Patient had a breakthrough reaction to IV CT contrast despite a 13 hour prep.  The patient should not have iodinated CT contrast ever again due to a breakthrough reaction.  . Shellfish Allergy Other (See Comments)    Shellfish-Migraine. Does fine with CT scans.    FAMILY HISTORY:  family history is not on file. SOCIAL HISTORY:  reports that  has never smoked. he has never used smokeless tobacco. He reports that he drinks alcohol. He reports that he does not  use drugs.  REVIEW OF SYSTEMS: Positives in BOLD  Constitutional: fever, chills, weight loss, malaise/fatigue and diaphoresis.  HENT: Negative for hearing loss, ear pain, nosebleeds, congestion, sore throat, neck pain, tinnitus and ear discharge.   Eyes: Negative for blurred vision, double vision, photophobia, pain,  discharge and redness.  Respiratory: Negative for cough, hemoptysis, sputum production, shortness of breath, wheezing and stridor.   Cardiovascular: Negative for chest pain, palpitations, orthopnea, claudication, leg swelling and PND.  Gastrointestinal:heartburn, nausea, vomiting, mild abdominal pain, diarrhea, constipation, blood in stool and melena.  Genitourinary: Negative for dysuria, urgency, frequency, hematuria and flank pain.  Musculoskeletal: Negative for myalgias, back pain, joint pain and falls.  Skin: Negative for itching and rash.  Neurological: Negative for dizziness, tingling, tremors, sensory change, speech change, focal weakness, seizures, loss of consciousness, weakness and headaches.  Endo/Heme/Allergies: Negative for environmental allergies and polydipsia. Does not bruise/bleed easily.  SUBJECTIVE:  c/o fatigue and chills   VITAL SIGNS: Temp:  [99.2 F (37.3 C)] 99.2 F (37.3 C) (02/22 0213) Pulse Rate:  [125-143] 125 (02/22 0313) Resp:  [24-44] 27 (02/22 0313) BP: (81-116)/(46-92) 91/52 (02/22 0313) SpO2:  [96 %-100 %] 96 % (02/22 0313) Weight:  [81.6 kg (180 lb)] 81.6 kg (180 lb) (02/22 0141)  PHYSICAL EXAMINATION: General: acutely ill appearing male, NAD  Neuro: lethargic, oriented, follows commands HEENT: supple, no JVD Cardiovascular: sinus tach, s1s2, no M/R/G Lungs: clear throughout, even, non labored  Abdomen: +BS x4, soft, mild tenderness, non distended  Musculoskeletal: normal bulk and tone, no edema  Skin: intact no rashes or lesions   Recent Labs  Lab 05/07/2017 0138  NA 131*  K 3.6  CL 103  CO2 15*  BUN 53*  CREATININE 2.53*  GLUCOSE 113*   Recent Labs  Lab 05/09/2017 0138  HGB 9.8*  HCT 29.7*  WBC 12.8*  PLT 453*   Dg Chest Portable 1 View  Result Date: 04/25/2017 CLINICAL DATA:  Weakness and fever EXAM: PORTABLE CHEST 1 VIEW COMPARISON:  03/21/2017 FINDINGS: The heart size and mediastinal contours are within normal limits. Both  lungs are clear. The visualized skeletal structures are unremarkable. IMPRESSION: No active disease. Electronically Signed   By: Donavan Foil M.D.   On: 05/12/2017 02:19    ASSESSMENT / PLAN: Septic Shock with hypotension of unknown etiology  Acute renal failure likely secondary to hypotension  Lactic Acidosis  Transaminitis  Seizure like activity  Possible recurrence of pancreatic cancer (currently receiving chemotherapy)  Biliary Drain (placed at [facility] on 03/26/2017 due to biliary obstruction) P: Maintain map >65 with fluid resuscitation initially if remains hypotensive will start neo-synephrine  NS @150  ml/hr Continuous telemetry monitoring  Trend BMP  Replace electrolytes as indicated  Monitor UOP  Avoid nephrotoxic medications  Trend WBC and monitor fever curve Trend PCT and lactic acid  Follow cultures  Continue current abx for now  r/o influenza  Prn zofran and phenergan for nausea  Subq heparin for VTE prophylaxis  Trend CBC Monitor for s/sx of bleeding and transfuse for hgb <7 Prn ativan for seizure activity   Marda Stalker, [place] Pager [phone] (please enter 7 digits) PCCM Consult Pager [phone] (please enter 7 digits)

## 2017-05-10 NOTE — Consult Note (Signed)
Jonathon Bellows , MD [address], [place], [place], [place], [address] [address] [place], [place], [place], [address] Phone: [phone]  Fax: [phone]  Consultation  Referring Provider: Dr Bridgett Larsson  Primary Care Physician:  Pauline Good Thornell Mule, MD Primary Gastroenterologist:  Dr. Francella Solian  Reason for Consultation: colitis and biliary drain issues   Date of Admission:  05/04/2017 Date of Consultation:  05/09/2017         HPI:   William Lara is a 47 y.o. male who carries a diagnosis of malignant neoplasm of the head of pancreas.  He follows with new cancer Dr. erroneous.  He seems to have been diagnosed with the cancer around 2015 commenced on chemotherapy in March 2017.  He underwent pancreatectomy, near total duodenectomy, cholecystectomy and Whipple's procedure in January 2017.  Noted to have peritoneal nodules in July 2018.  MRI in 12/06/2016 showed increase in soft tissue between the celiac axis and SMA with increase in narrowing of the proximal aspect of the celiac artery and SMA concerning for disease recurrence.  In January 2019 he was admitted with jaundice and sepsis.  In January 2019 he had an MRI of the abdomen which showed increasing abdomen upper abdominal adenopathy concerning for metastatic disease, increasing biliary ductal dilation, new cystic liver lesions cannot rule out metastasis.  He underwent an ultrasound-guided liver biopsy with no malignancy identified on 04/10/2017.  It appears an external drain was placed recently for biliary obstruction and sepsis.  It was draining well but the drain has stopped any form of drainage on the day of admission which is 05/07/2017 he was admitted with sepsis.  CT scan of the abdomen performed on 04/21/2017 showed progression of hepatic lesions, concern for hepatic abscesses gas within the lesions within the liver.   Patient in the ICU complains of abdominal pain , breathing rapidly, appears very sick, had his eyes closed   Past  Medical History:  Diagnosis Date  . Essential hypertension 05/09/2011  . Hyperthyroidism without crisis 02/16/2016  . Malignant tumor of head of pancreas ([place]) 10/19/2014  . Obstructive sleep apnea 05/09/2011  . Septic shock ([place]) 03/20/2017    Past Surgical History:  Procedure Laterality Date  . BILIARY DRAINAGE    . WHIPPLE PROCEDURE      Prior to Admission medications   Medication Sig Start Date End Date Taking? Authorizing Provider  Multiple Vitamin (MULTIVITAMIN WITH MINERALS) TABS tablet Take 1 tablet by mouth daily.   Yes [provider]  oxyCODONE (OXY IR/ROXICODONE) 5 MG immediate release tablet Take 5-10 mg by mouth every 4 (four) hours as needed for pain.   Yes [provider]  Vitamin D, Ergocalciferol, (DRISDOL) 50000 units CAPS capsule Take 50,000 capsules by mouth every Monday.   Yes [provider]  ZENPEP 40000-126000 units CPEP Take 2-3 capsules by mouth 3 (three) times daily. 2 capsules with snacks and 3 capsules with meals   Yes [provider]    Family History  Problem Relation Age of Onset  . Colon cancer Maternal Grandmother      Social History   Tobacco Use  . Smoking status: Never Smoker  . Smokeless tobacco: Never Used  Substance Use Topics  . Alcohol use: Yes    Alcohol/week: 0.0 - 0.6 oz    Comment: occasional  . Drug use: No    Allergies as of 04/26/2017 - Review Complete 04/22/2017  Allergen Reaction Noted  . Iodinated diagnostic agents Anaphylaxis, Shortness Of Breath, and Other (  See Comments) 06/12/2016  . Shellfish allergy Other (See Comments) 09/28/2014    Review of Systems:    All systems reviewed and negative except where noted in HPI.   Physical Exam:  Vital signs in last 24 hours: Temp:  [98.5 F (36.9 C)-101.7 F (38.7 C)] 98.5 F (36.9 C) (02/22 0800) Pulse Rate:  [110-143] 110 (02/22 0800) Resp:  [24-44] 27 (02/22 0900) BP: (66-116)/(31-92) 110/54 (02/22 0900) SpO2:  [96 %-100 %] 100 %  (02/22 0800) Weight:  [180 lb (81.6 kg)] 180 lb (81.6 kg) (02/22 0141) Last BM Date: 05/09/17 General:  Appears to be in pain  Head:  Normocephalic and atraumatic. Eyes:   PERRLA. Ears:  Normal auditory acuity. Neck:  Supple; no masses or thyroidomegaly Lungs: Respirations even and has rapid breathing . Lungs clear to auscultation bilaterally.   No wheezes, crackles, or rhonchi.  Heart:  Regular rate and rhythm;  Without murmur, clicks, rubs or gallops Abdomen:  Soft, RUQ drain, nondistended, nontender. Normal bowel sounds. No appreciable masses or hepatomegaly.  No rebound or guarding.  Neurologic:   grossly normal neurologically. Skin:  Intact without significant lesions or rashes. Cervical Nodes:  No significant cervical adenopathy.  LAB RESULTS: Recent Labs    05/08/2017 0138  WBC 12.8*  HGB 9.8*  HCT 29.7*  PLT 453*   BMET Recent Labs    04/23/2017 0138  NA 131*  K 3.6  CL 103  CO2 15*  GLUCOSE 113*  BUN 53*  CREATININE 2.53*  CALCIUM 8.0*   LFT Recent Labs    04/26/2017 0138  PROT 6.2*  ALBUMIN 2.9*  AST 63*  ALT 58  ALKPHOS 96  BILITOT 4.4*   PT/INR Recent Labs    05/01/2017 0138  LABPROT 20.6*  INR 1.79    STUDIES: Ct Abdomen Pelvis Wo Contrast  Result Date: 05/10/2017 CLINICAL DATA:  Pancreatic cancer, status post Whipple and biliary drain placement, fever. Sepsis. EXAM: CT ABDOMEN AND PELVIS WITHOUT CONTRAST TECHNIQUE: Multidetector CT imaging of the abdomen and pelvis was performed following the standard protocol without IV contrast. COMPARISON:  MRI abdomen dated 03/21/2017 FINDINGS: Lower chest: Lung bases are clear. Hepatobiliary: 4.9 x 3.1 x 4.1 cm fluid and gas collection in segment 8 of the liver (series 2/image 16), worrisome for hepatic abscess. Additional 2.4 x 1.3 x 3.0 cm fluid/gas collection in segment 4A (series 2/image 20), worrisome for additional abscess. Additional smaller lesions are suspected (for example, series 2/image 33), better  visualized on prior MRI. Status post cholecystectomy. Indwelling internal/external biliary drain. Pneumobilia. Pancreas: Status post Whipple with proximal pancreatectomy. Spleen: Within normal limits. Adrenals/Urinary Tract: Adrenal glands are within normal limits. 2 mm nonobstructing left lower pole renal calculus (series 2/image 45). Right kidney is within normal limits. No hydronephrosis. Bladder is within normal limits. Stomach/Bowel: Postsurgical changes related to partial gastrectomy with gastrojejunostomy. No evidence of bowel obstruction. Appendix is not discretely visualized. Vascular/Lymphatic: No evidence of abdominal aortic aneurysm. Atherosclerotic calcifications of the abdominal aorta and branch vessels. Upper abdominal nodal soft tissue measuring up to 1.8 cm short axis (series 2/image 29), worrisome for nodal metastases. Additional abnormal soft tissue abutting the celiac axis/SMA (series 2/image 34), poorly evaluated on unenhanced CT, worrisome for recurrence. Small retroperitoneal and bilateral pelvic lymph nodes, including a 10 mm short axis left external iliac node. Reproductive: Prostate is grossly unremarkable. Other: No abdominopelvic ascites. Postsurgical changes related to anterior abdominal wall ventral hernia repair. Musculoskeletal: Degenerative changes of the visualized thoracolumbar spine. IMPRESSION: Progression of  hepatic lesions from prior MRI, measuring up to 4.9 cm in the right hepatic lobe, worrisome for hepatic abscesses given gas within the lesions. Superinfection of hepatic metastases are also possible. Abnormal soft tissue abutting the celiac axis/SMA, poorly evaluated on unenhanced CT, worrisome for recurrence. Upper abdominal nodal metastases measuring up to 1.8 cm short axis. Additional small retroperitoneal/pelvic nodes measuring up to 10 mm. Additional ancillary findings as above. Electronically Signed   By: Julian Hy M.D.   On: 05/12/2017 08:03   Dg Abd 1  View  Result Date: 05/13/2017 CLINICAL DATA:  47 year old male with abdominal pain. EXAM: ABDOMEN - 1 VIEW COMPARISON:  Chest radiograph dated 04/26/2017 FINDINGS: There is prominence of the colonic fold which may represent colitis. Clinical correlation is recommended. An air-filled segment of bowel in the left mid abdomen with apparent thickened folds measuring up to 3.6 cm in diameter likely represents a portion of the colon. A mildly dilated segment of small bowel representing enteritis and associated ileus is less likely but not excluded clinical correlation is recommended partially visualized the tail drainage in the right upper quadrant, cholecystectomy clips, and hernia repair mesh noted. No acute osseous pathology IMPRESSION: 1. Thickened appearance of the colonic fold concerning for colitis. Clinical correlation is recommended 2. A segment of bowel measuring 3.6 cm in diameter in the left mid abdomen may represent portion of the colon or small bowel. If there is clinical concern for obstruction further evaluation with small bowel follow-through or CT of the abdomen pelvis is recommended. Electronically Signed   By: Anner Crete M.D.   On: 05/14/2017 05:28   Dg Chest Portable 1 View  Result Date: 04/21/2017 CLINICAL DATA:  Weakness and fever EXAM: PORTABLE CHEST 1 VIEW COMPARISON:  03/21/2017 FINDINGS: The heart size and mediastinal contours are within normal limits. Both lungs are clear. The visualized skeletal structures are unremarkable. IMPRESSION: No active disease. Electronically Signed   By: Donavan Foil M.D.   On: 04/23/2017 02:19      Impression / Plan:   William Lara is a 47 y.o. y/o male with pancreatic cancer, s/p whipples procedure, Biliary drain placed recently for drainage . Presently admitted with sepsis, on pressors  , acidotic . I have been asked to comment on the biliary drain.   If there is no drainage seen in the bag then would suggest to discuss with radiology  to inject some contrast into the drain to see if its in the right place. I see a mention of hepatic abscesses - may be worth discussing with ID/radiology of draining those collections as well(gas noted on CT scan).I have discussed the plan with Dr Ricke Hey  I will sign off.  Please call me if any further GI concerns or questions.  We would like to thank you for the opportunity to participate in the care of William Lara.   Thank you for involving me in the care of this patient.      LOS: 0 days   Jonathon Bellows, MD  05/10/2017, 9:18 AM

## 2017-05-10 NOTE — Progress Notes (Signed)
Pharmacy consult noted "Post IR Procedure Consult Anticoagulant/Antiplatelet PTA/Inpatient Med List Review by Pharmacist."  No prior to admission antiplatelet or anticoagulant noted. Mr. Bhat remains on appropriate DVT prophylaxis in the form of low dose unfractionated heparin.  Kayliana Codd A. [place], [place].D., BCPS Clinical Pharmacist 05/10/2017 16:55

## 2017-05-10 NOTE — Progress Notes (Signed)
Patient requiring 3 pressors at this time.  Patient now has central line access.   Patient still alert and oriented though lethargic.  He is sating 100% on 1 liter of oxygen.  Updated Dr. Jefferson Fuel about the CT abdomen results and called patient's mother and updated her on patient's condition at this time.  She stated that she is on her way back to hospital to speak to Dr. Jefferson Fuel.

## 2017-05-10 NOTE — ED Notes (Signed)
vomitted small amt yellow brown liquid

## 2017-05-11 ENCOUNTER — Other Ambulatory Visit: Payer: Self-pay

## 2017-05-11 ENCOUNTER — Encounter: Payer: Self-pay | Admitting: Adult Health

## 2017-05-11 LAB — COMPREHENSIVE METABOLIC PANEL
ALT: 4138 U/L — ABNORMAL HIGH (ref 17–63)
AST: 7035 U/L — ABNORMAL HIGH (ref 15–41)
Albumin: 1.8 g/dL — ABNORMAL LOW (ref 3.5–5.0)
Alkaline Phosphatase: 87 U/L (ref 38–126)
BUN: 55 mg/dL — ABNORMAL HIGH (ref 6–20)
CO2: <7 mmol/L — ABNORMAL LOW (ref 22–32)
Calcium: 6.7 mg/dL — ABNORMAL LOW (ref 8.9–10.3)
Chloride: 110 mmol/L (ref 101–111)
Creatinine, Ser: 2.97 mg/dL — ABNORMAL HIGH (ref 0.61–1.24)
GFR calc Af Amer: 28 mL/min — ABNORMAL LOW (ref >60)
GFR calc non Af Amer: 24 mL/min — ABNORMAL LOW (ref >60)
Glucose, Bld: 74 mg/dL (ref 65–99)
Potassium: 4.4 mmol/L (ref 3.5–5.1)
Sodium: 140 mmol/L (ref 135–145)
Total Bilirubin: 12.7 mg/dL — ABNORMAL HIGH (ref 0.3–1.2)
Total Protein: 4.3 g/dL — ABNORMAL LOW (ref 6.5–8.1)

## 2017-05-11 LAB — PHOSPHORUS: Phosphorus: UNDETERMINED mg/dL (ref 2.5–4.6)

## 2017-05-11 LAB — PREPARE FRESH FROZEN PLASMA
UNIT DIVISION: 0
Unit division: 0
Unit division: 0
Unit division: 0

## 2017-05-11 LAB — BPAM FFP
BLOOD PRODUCT EXPIRATION DATE: 201902272359
BLOOD PRODUCT EXPIRATION DATE: 201902272359
BLOOD PRODUCT EXPIRATION DATE: 201902282359
Blood Product Expiration Date: 201902282359
ISSUE DATE / TIME: 201902230144
ISSUE DATE / TIME: 201902221421
ISSUE DATE / TIME: 201902221802
UNIT TYPE AND RH: 600
UNIT TYPE AND RH: 8400
Unit Type and Rh: 8400
Unit Type and Rh: 6200

## 2017-05-11 LAB — URINE CULTURE: Culture: NO GROWTH

## 2017-05-11 LAB — MAGNESIUM: Magnesium: 2 mg/dL (ref 1.7–2.4)

## 2017-05-11 MED ORDER — POLYVINYL ALCOHOL 1.4 % OP SOLN
1.0000 [drp] | OPHTHALMIC | Status: DC | PRN
  Administered 2017-05-11: 1 [drp] via OPHTHALMIC
  Filled 2017-05-11: qty 15

## 2017-05-11 MED ORDER — PANTOPRAZOLE SODIUM 40 MG IV SOLR
40.0000 mg | INTRAVENOUS | Status: DC
  Administered 2017-05-11: 40 mg via INTRAVENOUS
  Filled 2017-05-11: qty 40

## 2017-05-12 LAB — BPAM RBC
BLOOD PRODUCT EXPIRATION DATE: 201903092359
Blood Product Expiration Date: 201903082359
ISSUE DATE / TIME: 201902222236
ISSUE DATE / TIME: 201902230152
UNIT TYPE AND RH: 6200
Unit Type and Rh: 6200

## 2017-05-12 LAB — TYPE AND SCREEN
ABO/RH(D): A POS
Antibody Screen: NEGATIVE
UNIT DIVISION: 0
Unit division: 0

## 2017-05-13 ENCOUNTER — Telehealth: Payer: Self-pay | Admitting: Pulmonary Disease

## 2017-05-13 LAB — LACTIC ACID, PLASMA: Lactic Acid, Venous: 14.2 mmol/L (ref 0.5–1.9)

## 2017-05-13 NOTE — Telephone Encounter (Signed)
Placed in DS' folder to be signed.

## 2017-05-13 NOTE — Telephone Encounter (Signed)
Recieved Death Certificate from [place] Delivered/Placed in Pulmonary box

## 2017-05-14 LAB — BLOOD GAS, ARTERIAL
FIO2: 0.35
MECHVT: 500 mL
PATIENT TEMPERATURE: 37
PCO2 ART: 20 mmHg — AB (ref 32.0–48.0)
PEEP: 5 cmH2O

## 2017-05-14 NOTE — Telephone Encounter (Signed)
Denice Paradise and [place]ville Silos notified death certificate ready for p/u.

## 2017-05-15 LAB — CULTURE, BLOOD (ROUTINE X 2)
Culture: NO GROWTH
Culture: NO GROWTH

## 2017-05-16 LAB — AEROBIC/ANAEROBIC CULTURE W GRAM STAIN (SURGICAL/DEEP WOUND)

## 2017-05-17 NOTE — Progress Notes (Signed)
Patient passed at 16:26.  Family at beside.  Dr. Jefferson Fuel made aware.

## 2017-05-17 NOTE — Progress Notes (Signed)
William Lara at [place] NAME: William Lara    MR#:  [id number]  DATE OF BIRTH:  [birth year]  SUBJECTIVE:  CHIEF COMPLAINT:   Chief Complaint  Patient presents with  . Fever  . Weakness   The patient is vent. On 4 pressor drip, BP69/34 REVIEW OF SYSTEMS:  Review of Systems  Unable to perform ROS: Intubated    DRUG ALLERGIES:   Allergies  Allergen Reactions  . Iodinated Diagnostic Agents Anaphylaxis, Shortness Of Breath and Other (See Comments)    Patient had a breakthrough reaction to IV CT contrast despite a 13 hour prep.  The patient should not have iodinated CT contrast ever again due to a breakthrough reaction.  . Shellfish Allergy Other (See Comments)    Shellfish-Migraine. Does fine with CT scans.   VITALS:  Blood pressure (!) 69/34, pulse (!) 105, temperature 99.7 F (37.6 C), resp. rate 20, height 6\' 4"  (1.93 m), weight 180 lb (81.6 kg), SpO2 100 %. PHYSICAL EXAMINATION:  Physical Exam  Constitutional:  Intubated, critical ill looking  HENT:  Head: Normocephalic.  Eyes:  Pupils are dilated.  Cardiovascular: Exam reveals no gallop.  No murmur heard. tachy  Pulmonary/Chest: He has no wheezes. He has no rales.  Abdominal: Soft. He exhibits no distension.  Hypoactive bowel sounds.  Musculoskeletal: He exhibits no edema.  Skin: No rash noted. No erythema.   LABORATORY PANEL:  Male CBC Recent Labs  Lab 05/13/2017 1954  WBC 4.5  HGB 6.6*  HCT 21.1*  PLT 278   ------------------------------------------------------------------------------------------------------------------ Chemistries  Recent Labs  Lab 05/12/2017 2207  NA 140  K 4.4  CL 110  CO2 <7*  GLUCOSE 74  BUN 55*  CREATININE 2.97*  CALCIUM 6.7*  MG 2.0  AST 7,035*  ALT 4,138*  ALKPHOS 87  BILITOT 12.7*   RADIOLOGY:  [place] Intraoperative  Result Date: 05/06/2017 CLINICAL DATA:  Ultrasound was provided for use by the ordering physician, and a  technical charge was applied by the performing facility.  No radiologist interpretation/professional services rendered.   Ir Catheter Tube Change  Result Date: 05/15/2017 INDICATION: History of pancreatic cancer with internal-external biliary drainage catheter placed at [facility]. Patient now admitted with sepsis and worsening LFTs with decreased output from the percutaneous biliary drainage catheter. As such, request made for fluoroscopic guided biliary drainage catheter exchange. EXAM: FLUOROSCOPIC GUIDED RIGHT-SIDED PERCUTANEOUS BILIARY DRAINAGE CATHETER COMPARISON:  COMPARISON CT abdomen and pelvis - 05/10/2017 CONTRAST:  None - patient has history of contrast allergy (anaphylaxis) and as such, no contrast was administered for this exchange. Note, the percutaneous biliary drainage catheter was found to be appropriate position on abdominal CT performed earlier today. FLUOROSCOPY TIME:  42 seconds (11 mGy) COMPLICATIONS: None immediate. TECHNIQUE: Informed written consent was obtained from the patient after a discussion of the risks, benefits and alternatives to treatment. Questions regarding the procedure were encouraged and answered. A timeout was performed prior to the initiation of the procedure. The external portion of the existing right-sided percutaneous biliary drainage catheter as well as the surrounding skin were prepped and draped in the usual sterile fashion. A sterile drape was applied covering the operative field. Maximum barrier sterile technique with sterile gowns and gloves were used for the procedure. A timeout was performed prior to the initiation of the procedure. A pre procedural spot fluoroscopic image was obtained of the right upper abdominal quadrant existing percutaneous biliary drainage catheter. The existing biliary drainage catheter was cut and  cannulated with a stiff Glidewire wire which was coiled within the proximal small bowel. Under intermittent fluoroscopic guidance, the existing  PBD was exchanged for a new , slightly larger now 12 [place] PBD. The catheter was locked and secured to the skin with a single interrupted suture. The biliary drainage catheter was capped. A dressing was placed. The patient tolerated the procedure well without immediate postprocedural complication. FINDINGS: After successful fluoroscopic guided exchange, the new slightly larger now 27 [place] percutaneous biliary catheter overlies expected location of the hepaticojejunostomy. IMPRESSION: Successful fluoroscopic guided exchange and up sizing of right-sided now 32 French percutaneous biliary drainage catheter with end coiled and locked overlying the expected location of the hepatic jejunostomy. Electronically Signed   By: Sandi Mariscal M.D.   On: 04/21/2017 17:50   Dg Chest Port 1 View  Result Date: 05/15/2017 CLINICAL DATA:  47 y/o  M; endotracheal tube placement. EXAM: PORTABLE CHEST 1 VIEW COMPARISON:  05/13/2017 chest radiograph FINDINGS: Stable cardiac silhouette. Endotracheal tube 3.1 cm above carina. Enteric tube tip below the field of view and abdomen. Multiple partially visualized right upper quadrant drainage catheters. Clear lungs. No pleural effusion or pneumothorax. IMPRESSION: Endotracheal tube 3.1 cm above carina. Enteric tube tip below field of view and abdomen. Electronically Signed   By: Kristine Garbe M.D.   On: 04/23/2017 18:35   Ct Image Guided Drainage By Percutaneous Catheter  Result Date: 05/16/2017 INDICATION: History of pancreatic cancer complicated by local recurrence and biliary obstruction post internal-external biliary drainage catheter placement at [facility]. Patient now presents with sepsis and findings worrisome for development of 2 hepatic abscesses. As such, request made for image guided hepatic abscess drainage catheter placement for infection source control purposes. EXAM: ULTRASOUND AND CT-GUIDED HEPATIC ABSCESS DRAINAGE CATHETER  PLACEMENT X2 COMPARISON:  CT abdomen and pelvis - 05/09/2017; MRCP - 03/21/2017 MEDICATIONS: The patient is currently admitted to the hospital and receiving intravenous antibiotics. The antibiotics were administered within an appropriate time frame prior to the initiation of the procedure. ANESTHESIA/SEDATION: Moderate (conscious) sedation was employed during this procedure. A total of Versed 1.5 mg and Fentanyl 100 mcg was administered intravenously. Moderate Sedation Time: 38 minutes. The patient's level of consciousness and vital signs were monitored continuously by radiology nursing throughout the procedure under my direct supervision. CONTRAST:  None COMPLICATIONS: None immediate. PROCEDURE: Informed written consent was obtained from the patient after a discussion of the risks, benefits and alternatives to treatment. The patient was placed supine on the CT gantry and a pre procedural CT was performed re-demonstrating the known abscess/fluid collections within the liver with dominant air in fluid containing collection measuring approximately 4.7 x 3.4 cm (image 11 series 2 an additional collection measuring approximately 2.0 x 1.8 cm (21, series 2). The procedure was planned. A timeout was performed prior to the initiation of the procedure. The skin overlying the right upper abdominal quadrant was prepped and draped in usual sterile fashion. Under direct ultrasound guidance, both complex fluid collections were accessed with 18 gauge trocar needles. Short Amplatz wires were coiled within both collections. Next, CT imaging was performed to confirm appropriate positioning. Next, both tracts were dilated ultimately allowing placement of a 10 French percutaneous drainage catheter into the dominant collection and an 8 French percutaneous drainage catheter into the smaller collection. Postprocedural imaging demonstrates appropriate positioning of both percutaneous drainage catheters. Approximately 25 cc of blood-tinged  purulent appearing fluid was aspirated from the dominant collection while approximately 5 cc of purulent  appearing material was aspirated from the smaller collection. Samples from both collections were sent separately to the laboratory for analysis. Both drainage catheters were secured at the skin entrance site within interrupted suture and a Stat Lock device. Dressings were placed. Both tubes were connected to JP bulbs. The patient tolerated the procedure well immediate postprocedural complication. The patient tolerated the procedure well without immediate post procedural complication. IMPRESSION: 1. Successful ultrasound and CT-guided guided placement of a 10 [place] all purpose drain catheter into the dominant abscess within the dome of the right lobe of the liver with aspiration of approximately 25 cc of blood tinged purulent fluid. Samples were sent to the laboratory as requested by the ordering clinical team. 2. Successful ultrasound and CT-guided placement of an 8 French all-purpose drainage catheter into smaller subpleural collection within the anterior segment of the right lobe of the liver yielding approximately 5 cc of purulent appearing fluid. Samples were sent to the laboratory as requested by the ordering clinical team. Electronically Signed   By: Sandi Mariscal M.D.   On: 04/23/2017 17:44   ASSESSMENT AND PLAN:  Acute respiratory failure due to acidosis. S/p intubation, on vent.  1.  Sepsic shock due to hepatic abscess. On abx and 4 pressor drips.  2.  Acute on chronic kidney failure: Prerenal; secondary to sepsis. He is treated with intravenous fluid. Worsening. 3. Severe lactic Acidosis  4. DIC and shock liver. 5. Anemia of chronic disease versus acute blood loss, s/p 2 units PRBC transfusion. 6. Pancreatic cancer: Metastatic; status post Whipple.  Very poor prognosis. His mother wants to keep pressors until 5 pm, then may decide comfort care.  All the records are reviewed and case  discussed with Care Management/Social Worker. Management plans discussed with the patient's mother and father and they are in agreement.  CODE STATUS: DNR  TOTAL TIME TAKING CARE OF THIS PATIENT: 36 minutes.   More than 50% of the time was spent in counseling/coordination of care: YES   Demetrios Loll M.D on 05/12/17 at 12:31 PM  Between 7am to 6pm - Pager - [phone]  After 6pm go to [email or web address] - [facility]

## 2017-05-17 NOTE — Progress Notes (Signed)
Name: William Lara MRN: [id number] DOB: Jun 15, 1970    ADMISSION DATE:  05/09/2017 CONSULTATION DATE: 05/08/2017  REFERRING MD : Dr. Marcille Blanco   CHIEF COMPLAINT: Seizure Activity   BRIEF PATIENT DESCRIPTION:  47 yo male admitted with seizure like activity and septic shock of unknown etiology   SIGNIFICANT EVENTS  02/22-Pt admitted to stepdown unit  02/23 DNR  STUDIES:  Fluoroscopic guided biliary drain placement  HISTORY OF PRESENT ILLNESS:   This is a 47 yo male with a PMH of OSA, Pancreatic Cancer  s/p Whipple Procedure (03/25/2015 at [facility]), Dumping Syndrome, HTN, PE on chronic anticoagulation and Former Smoker.  Recently admitted at [facility] and transferred to [facility] 03/22/17 with Klebsiella bacteremia in the setting of possible recurrent pancreatic cancer with mets.  He was found to have a biliary obstruction 03/24/17, therefore biliary drain placed on 03/26/17.  On 04/10/17 underwent US guided liver biopsy findings consistent with bile leak no malignancy identified.  During his most recent visit with his oncologist at [facility] on 04/22/17 recommended starting chemotherapy due to high likelihood of pancreatic cancer recurrence due to clinical decline, although unable to biopsy suspicious shadow due to vessel involvement to prove recurrence of metastatic pancreatic cancer. On 02/22 the pt presented to [facility] ER via EMS with seizure like activity (pt not postictal), generalized weakness/fatigue, and fever.  He also had 2 falls without injury on 04/22/2017. He recently had chemotherapy on 05/07/17, he has had a total of 2 chemo sessions.  According to pts mother typically on the 3rd day following chemotherapy he develops fatigue, nausea, and vomiting.  In the ER he had multiple episodes of emesis and hypotension.  Lab results revealed Na+ 131, BUN 53, creatinine 2.53, AST 63, lactic acid 4.6, wbc 12.8, hgb 9.8, and CXR negative.  Sepsis protocol initiated he received iv abx and 4L NS bolus.  He was  subsequently admitted to the stepdown unit by hospitalist team for further workup and treatment.    SUBJECTIVE:  Patient remained in severe shock, severe metabolic acidosis, DIC with multiorgan failure overnight.  Urine output remained null. Currently on maximum doses of vasopressin, norepinephrine, epinephrine, and phenylephrine.  ABG shows persistent acidosis.  Bicarb infusion and pushes given with minimal effect.  Repeat CBC showed a drop in hemoglobin requiring 2 units of packed red blood cells.  Overall prognosis is poor.  I have discussed all lab results in current patient condition with patient's mother and the rest of the family.  At this point patient has been made a DO NOT RESUSCITATE.  VITAL SIGNS: Temp:  [91.6 F (33.1 C)-101.1 F (38.4 C)] 101.1 F (38.4 C) (02/23 0600) Pulse Rate:  [30-128] 116 (02/23 0600) Resp:  [17-39] 22 (02/23 0600) BP: (66-135)/(21-77) 77/39 (02/23 0600) SpO2:  [85 %-100 %] 100 % (02/23 0600) FiO2 (%):  [25 %-35 %] 35 % (02/23 0330)  PHYSICAL EXAMINATION: General: acutely ill appearing male, intubated Neuro: Sedated, unresponsive to voice touch and noxious stimulus, pupils are dilated and fixed, no corneal reflexes HEENT: supple, no JVD Cardiovascular: sinus tach, s1s2, no M/R/G Lungs: Bilateral breath sounds, no wheezes or rhonchi Abdomen: Mildly distended, drains in place Musculoskeletal: normal bulk and tone, no edema  Skin: Jaundiced, pale  Recent Labs  Lab 05/12/2017 0138 05/01/2017 2207  NA 131* 140  K 3.6 4.4  CL 103 110  CO2 15* <7*  BUN 53* 55*  CREATININE 2.53* 2.97*  GLUCOSE 113* 74   Recent Labs  Lab 05/08/2017 0138 04/23/2017 1954  HGB 9.8* 6.6*  HCT 29.7* 21.1*  WBC 12.8* 4.5  PLT 453* 278   Ct Abdomen Pelvis Wo Contrast  Result Date: 04/23/2017 CLINICAL DATA:  Pancreatic cancer, status post Whipple and biliary drain placement, fever. Sepsis. EXAM: CT ABDOMEN AND PELVIS WITHOUT CONTRAST TECHNIQUE: Multidetector CT imaging of  the abdomen and pelvis was performed following the standard protocol without IV contrast. COMPARISON:  MRI abdomen dated 03/21/2017 FINDINGS: Lower chest: Lung bases are clear. Hepatobiliary: 4.9 x 3.1 x 4.1 cm fluid and gas collection in segment 8 of the liver (series 2/image 16), worrisome for hepatic abscess. Additional 2.4 x 1.3 x 3.0 cm fluid/gas collection in segment 4A (series 2/image 20), worrisome for additional abscess. Additional smaller lesions are suspected (for example, series 2/image 33), better visualized on prior MRI. Status post cholecystectomy. Indwelling internal/external biliary drain. Pneumobilia. Pancreas: Status post Whipple with proximal pancreatectomy. Spleen: Within normal limits. Adrenals/Urinary Tract: Adrenal glands are within normal limits. 2 mm nonobstructing left lower pole renal calculus (series 2/image 45). Right kidney is within normal limits. No hydronephrosis. Bladder is within normal limits. Stomach/Bowel: Postsurgical changes related to partial gastrectomy with gastrojejunostomy. No evidence of bowel obstruction. Appendix is not discretely visualized. Vascular/Lymphatic: No evidence of abdominal aortic aneurysm. Atherosclerotic calcifications of the abdominal aorta and branch vessels. Upper abdominal nodal soft tissue measuring up to 1.8 cm short axis (series 2/image 29), worrisome for nodal metastases. Additional abnormal soft tissue abutting the celiac axis/SMA (series 2/image 34), poorly evaluated on unenhanced CT, worrisome for recurrence. Small retroperitoneal and bilateral pelvic lymph nodes, including a 10 mm short axis left external iliac node. Reproductive: Prostate is grossly unremarkable. Other: No abdominopelvic ascites. Postsurgical changes related to anterior abdominal wall ventral hernia repair. Musculoskeletal: Degenerative changes of the visualized thoracolumbar spine. IMPRESSION: Progression of hepatic lesions from prior MRI, measuring up to 4.9 cm in the  right hepatic lobe, worrisome for hepatic abscesses given gas within the lesions. Superinfection of hepatic metastases are also possible. Abnormal soft tissue abutting the celiac axis/SMA, poorly evaluated on unenhanced CT, worrisome for recurrence. Upper abdominal nodal metastases measuring up to 1.8 cm short axis. Additional small retroperitoneal/pelvic nodes measuring up to 10 mm. Additional ancillary findings as above. Electronically Signed   By: Julian Hy M.D.   On: 04/30/2017 08:03   Dg Abd 1 View  Result Date: 04/20/2017 CLINICAL DATA:  47 year old male with abdominal pain. EXAM: ABDOMEN - 1 VIEW COMPARISON:  Chest radiograph dated 05/14/2017 FINDINGS: There is prominence of the colonic fold which may represent colitis. Clinical correlation is recommended. An air-filled segment of bowel in the left mid abdomen with apparent thickened folds measuring up to 3.6 cm in diameter likely represents a portion of the colon. A mildly dilated segment of small bowel representing enteritis and associated ileus is less likely but not excluded clinical correlation is recommended partially visualized the tail drainage in the right upper quadrant, cholecystectomy clips, and hernia repair mesh noted. No acute osseous pathology IMPRESSION: 1. Thickened appearance of the colonic fold concerning for colitis. Clinical correlation is recommended 2. A segment of bowel measuring 3.6 cm in diameter in the left mid abdomen may represent portion of the colon or small bowel. If there is clinical concern for obstruction further evaluation with small bowel follow-through or CT of the abdomen pelvis is recommended. Electronically Signed   By: Anner Crete M.D.   On: 04/28/2017 05:28   [place] Intraoperative  Result Date: 04/22/2017 CLINICAL DATA:  Ultrasound was provided for use  by the ordering physician, and a technical charge was applied by the performing facility.  No radiologist interpretation/professional services  rendered.   Ir Catheter Tube Change  Result Date: 04/28/2017 INDICATION: History of pancreatic cancer with internal-external biliary drainage catheter placed at [facility]. Patient now admitted with sepsis and worsening LFTs with decreased output from the percutaneous biliary drainage catheter. As such, request made for fluoroscopic guided biliary drainage catheter exchange. EXAM: FLUOROSCOPIC GUIDED RIGHT-SIDED PERCUTANEOUS BILIARY DRAINAGE CATHETER COMPARISON:  COMPARISON CT abdomen and pelvis - 04/28/2017 CONTRAST:  None - patient has history of contrast allergy (anaphylaxis) and as such, no contrast was administered for this exchange. Note, the percutaneous biliary drainage catheter was found to be appropriate position on abdominal CT performed earlier today. FLUOROSCOPY TIME:  42 seconds (11 mGy) COMPLICATIONS: None immediate. TECHNIQUE: Informed written consent was obtained from the patient after a discussion of the risks, benefits and alternatives to treatment. Questions regarding the procedure were encouraged and answered. A timeout was performed prior to the initiation of the procedure. The external portion of the existing right-sided percutaneous biliary drainage catheter as well as the surrounding skin were prepped and draped in the usual sterile fashion. A sterile drape was applied covering the operative field. Maximum barrier sterile technique with sterile gowns and gloves were used for the procedure. A timeout was performed prior to the initiation of the procedure. A pre procedural spot fluoroscopic image was obtained of the right upper abdominal quadrant existing percutaneous biliary drainage catheter. The existing biliary drainage catheter was cut and cannulated with a stiff Glidewire wire which was coiled within the proximal small bowel. Under intermittent fluoroscopic guidance, the existing PBD was exchanged for a new , slightly larger now 12 [place] PBD. The catheter was locked and secured to the  skin with a single interrupted suture. The biliary drainage catheter was capped. A dressing was placed. The patient tolerated the procedure well without immediate postprocedural complication. FINDINGS: After successful fluoroscopic guided exchange, the new slightly larger now 74 [place] percutaneous biliary catheter overlies expected location of the hepaticojejunostomy. IMPRESSION: Successful fluoroscopic guided exchange and up sizing of right-sided now 18 French percutaneous biliary drainage catheter with end coiled and locked overlying the expected location of the hepatic jejunostomy. Electronically Signed   By: Sandi Mariscal M.D.   On: 04/23/2017 17:50   Dg Chest Port 1 View  Result Date: 05/14/2017 CLINICAL DATA:  47 y/o  M; endotracheal tube placement. EXAM: PORTABLE CHEST 1 VIEW COMPARISON:  05/04/2017 chest radiograph FINDINGS: Stable cardiac silhouette. Endotracheal tube 3.1 cm above carina. Enteric tube tip below the field of view and abdomen. Multiple partially visualized right upper quadrant drainage catheters. Clear lungs. No pleural effusion or pneumothorax. IMPRESSION: Endotracheal tube 3.1 cm above carina. Enteric tube tip below field of view and abdomen. Electronically Signed   By: Kristine Garbe M.D.   On: 04/26/2017 18:35   Dg Chest Portable 1 View  Result Date: 05/04/2017 CLINICAL DATA:  Weakness and fever EXAM: PORTABLE CHEST 1 VIEW COMPARISON:  03/21/2017 FINDINGS: The heart size and mediastinal contours are within normal limits. Both lungs are clear. The visualized skeletal structures are unremarkable. IMPRESSION: No active disease. Electronically Signed   By: Donavan Foil M.D.   On: 04/26/2017 02:19   Ct Image Guided Drainage By Percutaneous Catheter  Result Date: 05/12/2017 INDICATION: History of pancreatic cancer complicated by local recurrence and biliary obstruction post internal-external biliary drainage catheter placement at [facility].  Patient now presents with  sepsis and findings worrisome for development of 2 hepatic abscesses. As such, request made for image guided hepatic abscess drainage catheter placement for infection source control purposes. EXAM: ULTRASOUND AND CT-GUIDED HEPATIC ABSCESS DRAINAGE CATHETER PLACEMENT X2 COMPARISON:  CT abdomen and pelvis - 05/06/2017; MRCP - 03/21/2017 MEDICATIONS: The patient is currently admitted to the hospital and receiving intravenous antibiotics. The antibiotics were administered within an appropriate time frame prior to the initiation of the procedure. ANESTHESIA/SEDATION: Moderate (conscious) sedation was employed during this procedure. A total of Versed 1.5 mg and Fentanyl 100 mcg was administered intravenously. Moderate Sedation Time: 38 minutes. The patient's level of consciousness and vital signs were monitored continuously by radiology nursing throughout the procedure under my direct supervision. CONTRAST:  None COMPLICATIONS: None immediate. PROCEDURE: Informed written consent was obtained from the patient after a discussion of the risks, benefits and alternatives to treatment. The patient was placed supine on the CT gantry and a pre procedural CT was performed re-demonstrating the known abscess/fluid collections within the liver with dominant air in fluid containing collection measuring approximately 4.7 x 3.4 cm (image 11 series 2 an additional collection measuring approximately 2.0 x 1.8 cm (21, series 2). The procedure was planned. A timeout was performed prior to the initiation of the procedure. The skin overlying the right upper abdominal quadrant was prepped and draped in usual sterile fashion. Under direct ultrasound guidance, both complex fluid collections were accessed with 18 gauge trocar needles. Short Amplatz wires were coiled within both collections. Next, CT imaging was performed to confirm appropriate positioning. Next, both tracts were dilated ultimately allowing placement of a  10 French percutaneous drainage catheter into the dominant collection and an 8 French percutaneous drainage catheter into the smaller collection. Postprocedural imaging demonstrates appropriate positioning of both percutaneous drainage catheters. Approximately 25 cc of blood-tinged purulent appearing fluid was aspirated from the dominant collection while approximately 5 cc of purulent appearing material was aspirated from the smaller collection. Samples from both collections were sent separately to the laboratory for analysis. Both drainage catheters were secured at the skin entrance site within interrupted suture and a Stat Lock device. Dressings were placed. Both tubes were connected to JP bulbs. The patient tolerated the procedure well immediate postprocedural complication. The patient tolerated the procedure well without immediate post procedural complication. IMPRESSION: 1. Successful ultrasound and CT-guided guided placement of a 10 [place] all purpose drain catheter into the dominant abscess within the dome of the right lobe of the liver with aspiration of approximately 25 cc of blood tinged purulent fluid. Samples were sent to the laboratory as requested by the ordering clinical team. 2. Successful ultrasound and CT-guided placement of an 8 French all-purpose drainage catheter into smaller subpleural collection within the anterior segment of the right lobe of the liver yielding approximately 5 cc of purulent appearing fluid. Samples were sent to the laboratory as requested by the ordering clinical team. Electronically Signed   By: Sandi Mariscal M.D.   On: 05/05/2017 17:44    ASSESSMENT  Acute respiratory failure secondary to severe metabolic acidosis Severe septic shock secondary to hepatic abscesses Acute renal failure likely secondary to shock Severe lactic Acidosis  Consumptive coagulopathy secondary to DIC-INR is currently 5.26 Anemia of chronic disease versus acute blood loss Transaminitis    Seizure like activity-resolved Possible recurrence of pancreatic cancer (currently receiving chemotherapy)  Biliary Drain (placed at [facility] on 03/26/2017 due to biliary obstruction)  PLAN Overall prognosis is poor; patient is probably not going to survive  overnight.  Goal is to terminally wean at some point.  Awaiting family's decision Continue pressors  Transfuse 2 units of packed red blood cells Mother declined fresh frozen plasma infusion Full vent support with current settings Continue bicarb infusion No more lab draws per patient's mother Monitor UOP  Continue current abx for now  Fentanyl and as needed Versed for sedation and vent discomfort SCDs PPI for GI prophylaxis Spiritual care consulted-family declined.  Support provided to the family at bedside Continue supportive care until family is ready for one-way extubation  Plan of care discussed with Dr. Jefferson Fuel and Warren Lacy.   Magdalene S. [facility] ANP-BC Pulmonary and Critical Care Medicine [facility] Pager [phone] or [phone]  NB: This document was prepared using Dragon voice recognition software and may include unintentional dictation errors.

## 2017-05-17 NOTE — Progress Notes (Signed)
Per family's request, no more lab draws.  Mother refused plasma order.  Maggie NP notified.

## 2017-05-17 NOTE — Death Summary Note (Signed)
Death summary  May 21, 2017  Date of admission: 05/20/17  Deceased: 05/21/2017  Hospital course/death summary:  Mr. Acebo was a 47 year old gentleman with a past medical history remarkable for pancreatic cancer, status post Whipple procedure, dumping syndrome, hypertension, pulmonary embolism on chronic anticoagulation, was admitted back in January for Klebsiella bacteremia and was found to have biliary obstruction resulting in percutaneous drain. Has recently seen oncology and felt high likelihood of recurrence of his pancreatic cancer and has been started back on chemotherapy. He Presented to the emergency department with altered mental status, seizure-like activity, falls, general weakness, fatigue and fever. In the emergency room patient was hypotensive requiring pressors, multiple episodes of emesis, pertinent labs revealed a BUN 53, creatinine 2.53, elevated liver function tests, lactic acid of 4.6 and sodium of 131. He was started on sepsis protocol, broad-spectrum coverage, fluid resuscitation, pressors and central line is being placed. He had an emergent CT scan of the abdomen   CT scan resulted: Progression of hepatic lesions from prior MRI, measuring up to 4.9 cm in the right hepatic lobe, worrisome for hepatic abscesses given gas within the lesions. Superinfection of hepatic metastases are also possible.  Abnormal soft tissue abutting the celiac axis/SMA, poorly evaluated on unenhanced CT, worrisome for recurrence.  Upper abdominal nodal metastases measuring up to 1.8 cm short axis. Additional small retroperitoneal/pelvic nodes measuring up to 10 mm.  He underwent percutaneous drainage of hepatic abscess and replacement of percutaneous biliary drainage by interventional radiology. Throughout the course of the day his blood pressure decreased, central line was placed and he was started on vasopressin, norepinephrine and Neo-Synephrine. After he returned from interventional  radiology was noted that his mentation had decreased and he was starting to desaturate and he was subsequently intubated. Throughout the evening he developed progressive severe acidosis, refractory to multiple boluses of bicarbonate, he was started also on an epinephrine infusion. Discussion with family regarding the institution of CRRT and they did not wish to go this route. They wish to give him 24 hour period of time to see if he would show signs of improvement. Unfortunately this afternoon patient suffered a cardiac arrest. Please see nurses notes for time of death.  Hermelinda Dellen, D.O.

## 2017-05-17 NOTE — Progress Notes (Signed)
Lactic acid and protime-INR not drawn.  Family requested to not draw labs at this time.  NP aware.

## 2017-05-17 DEATH — deceased

## 2019-09-10 IMAGING — US US ABDOMEN LIMITED
2 series · 13 of 25 positions shown · non-contrast
Comparison: None.

CLINICAL DATA: Status post Whipple procedure for pancreatic cancer.

EXAM:
ULTRASOUND ABDOMEN LIMITED RIGHT UPPER QUADRANT

[Series 1: us abdomen limited · 0.30mm/px · 12 of 25 slices shown (1 of 2)]
[im 1/25]
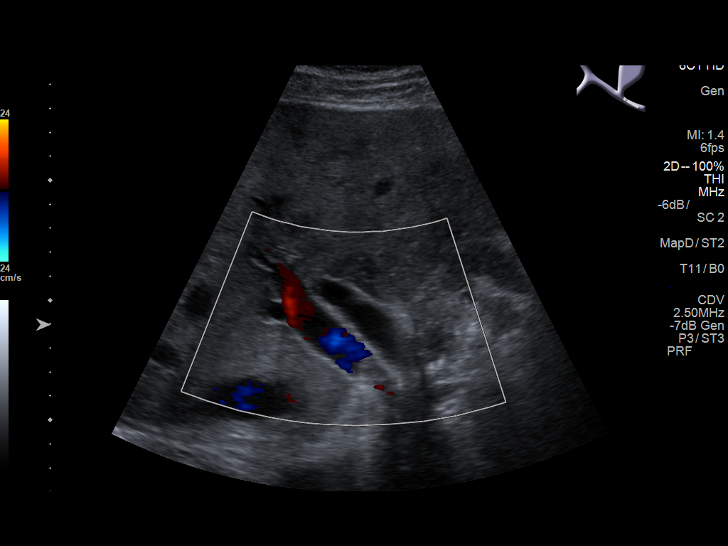
[im 3/25]
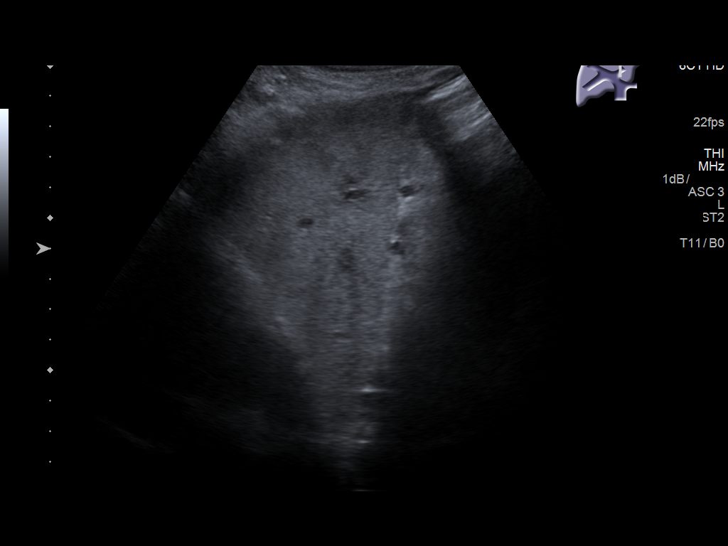
[im 5/25]
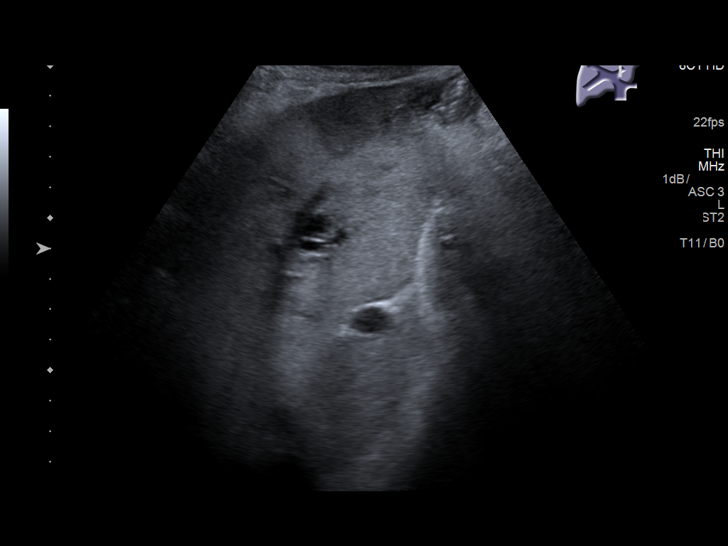
[im 7/25]
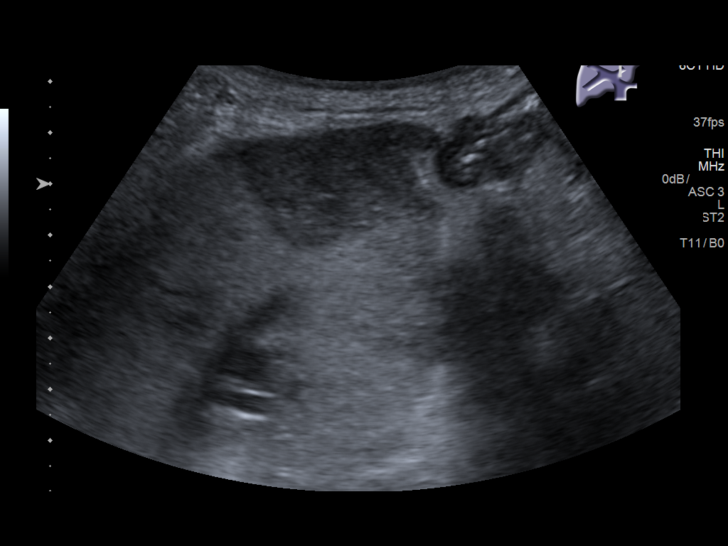
[im 9/25]
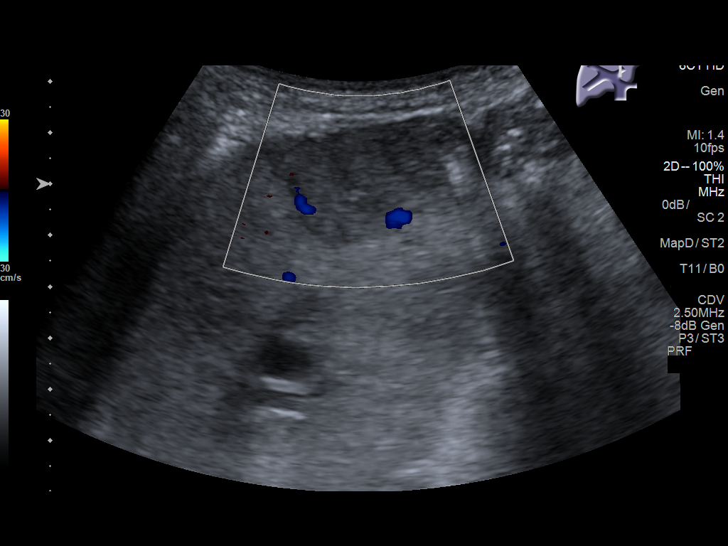
[im 11/25]
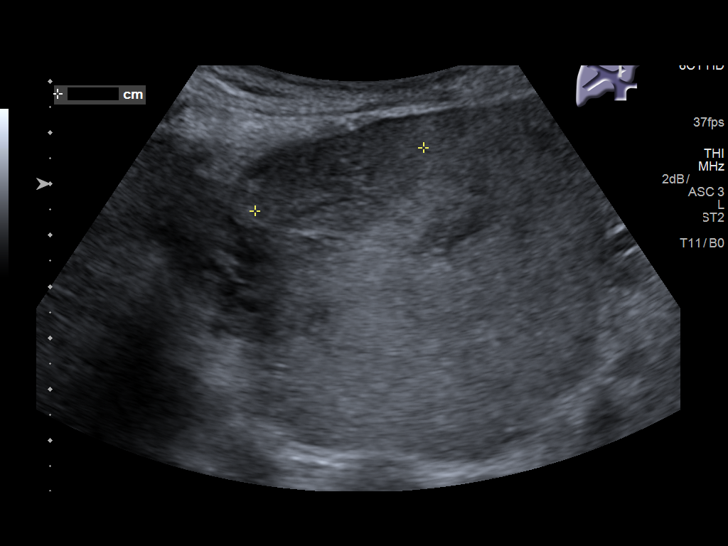
[im 14/25]
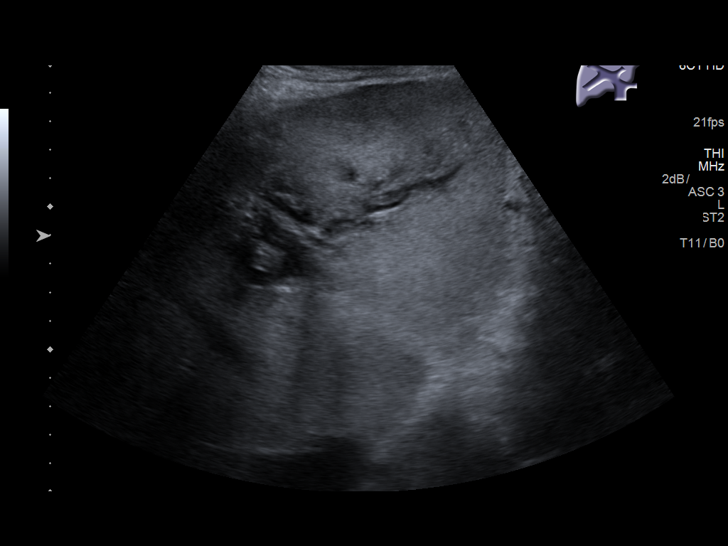
[im 16/25]
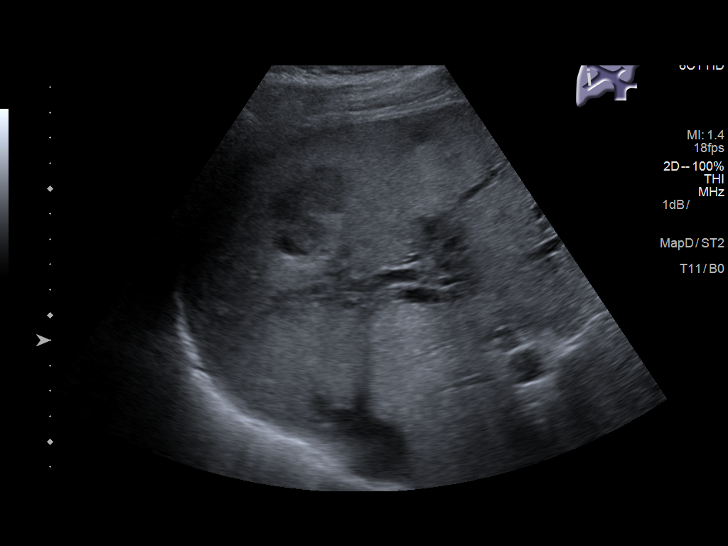
[im 18/25]
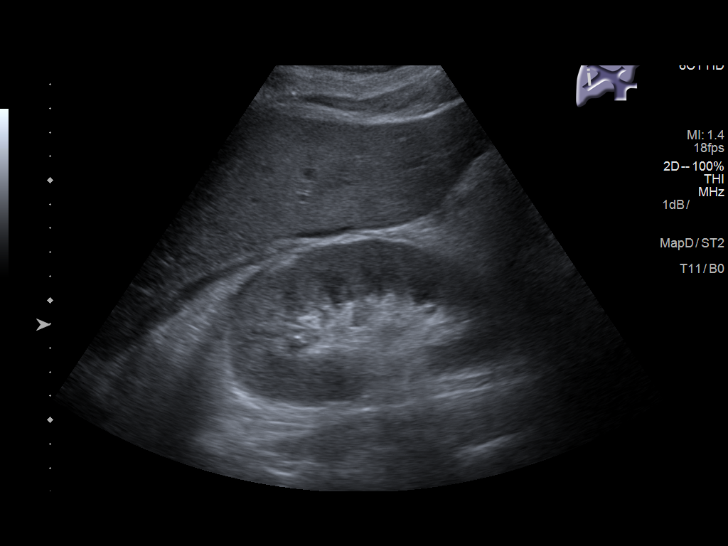
[im 20/25]
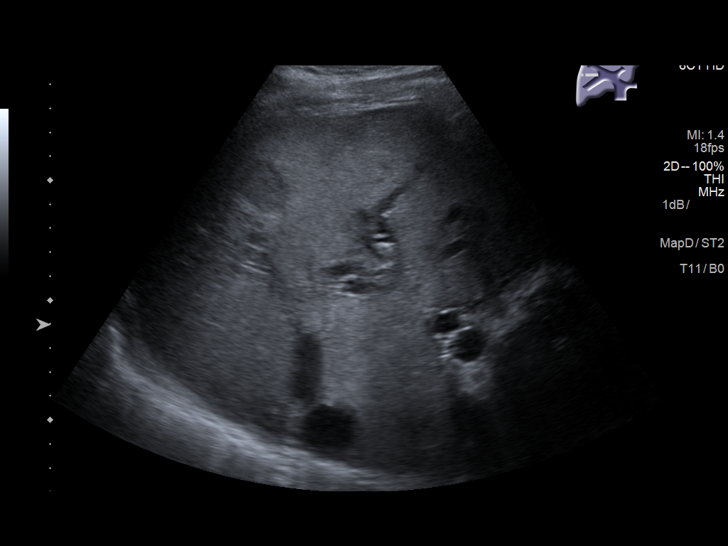
[im 22/25]
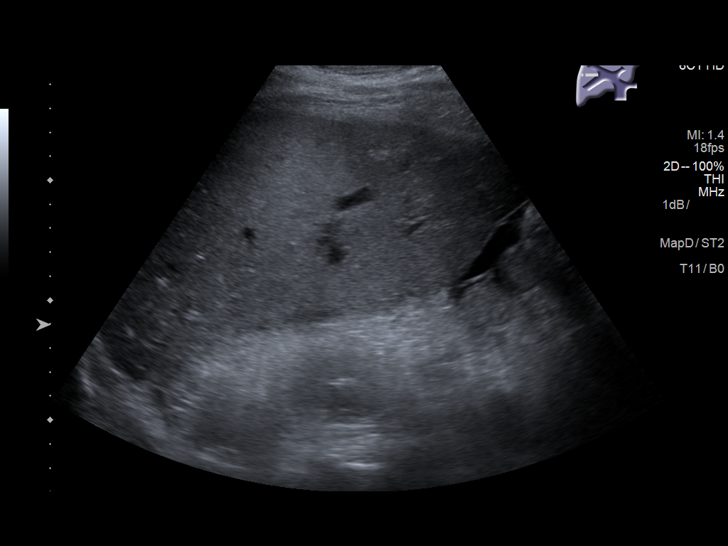
[im 25/25]
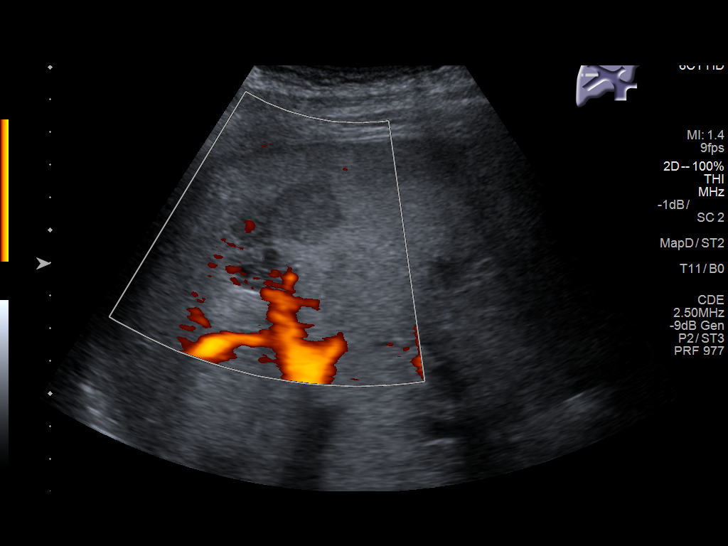

[Series 2001: us abdomen limited · 0.30mm/px · 1 of 2 slices shown (2 of 2)]
[im 2/2]
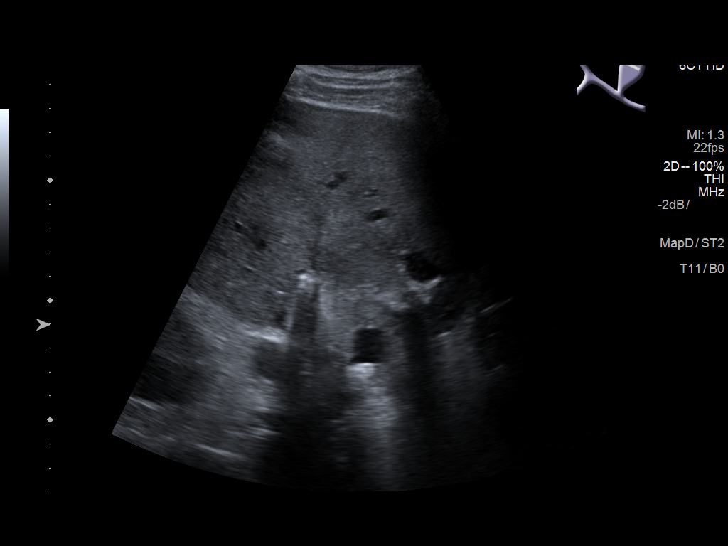

[13 of 25 positions shown; findings below may reference images not displayed]

FINDINGS: Gallbladder:

Status post cholecystectomy.

Common bile duct:

Diameter: 9 mm which is mildly dilated most consistent with post
cholecystectomy status.

Liver:

Increased echogenicity of hepatic parenchyma is noted. Intrahepatic
biliary dilatation is noted. Multiple low densities are noted
throughout hepatic parenchyma consistent with metastatic disease,
the largest measuring 4.4 cm in right hepatic lobe. Portal vein is
patent on color Doppler imaging with normal direction of blood flow
towards the liver.
IMPRESSION: Status post cholecystectomy. Mild intrahepatic and extrahepatic
biliary dilatation is noted which may be due to post cholecystectomy
status, but distal common bile duct obstruction cannot be excluded,
and correlation with liver function tests is noted.

Multiple hepatic masses are noted consistent with metastatic
disease, the largest measuring 4.4 cm in right hepatic lobe.

## 2019-12-22 IMAGING — DX DG CHEST 1V PORT
1 series · 1 of 1 positions shown · non-contrast
Comparison: 02/09/2018

CLINICAL DATA: Hypoxia, fever and altered mental status.

EXAM:
PORTABLE CHEST 1 VIEW

[chest ap]
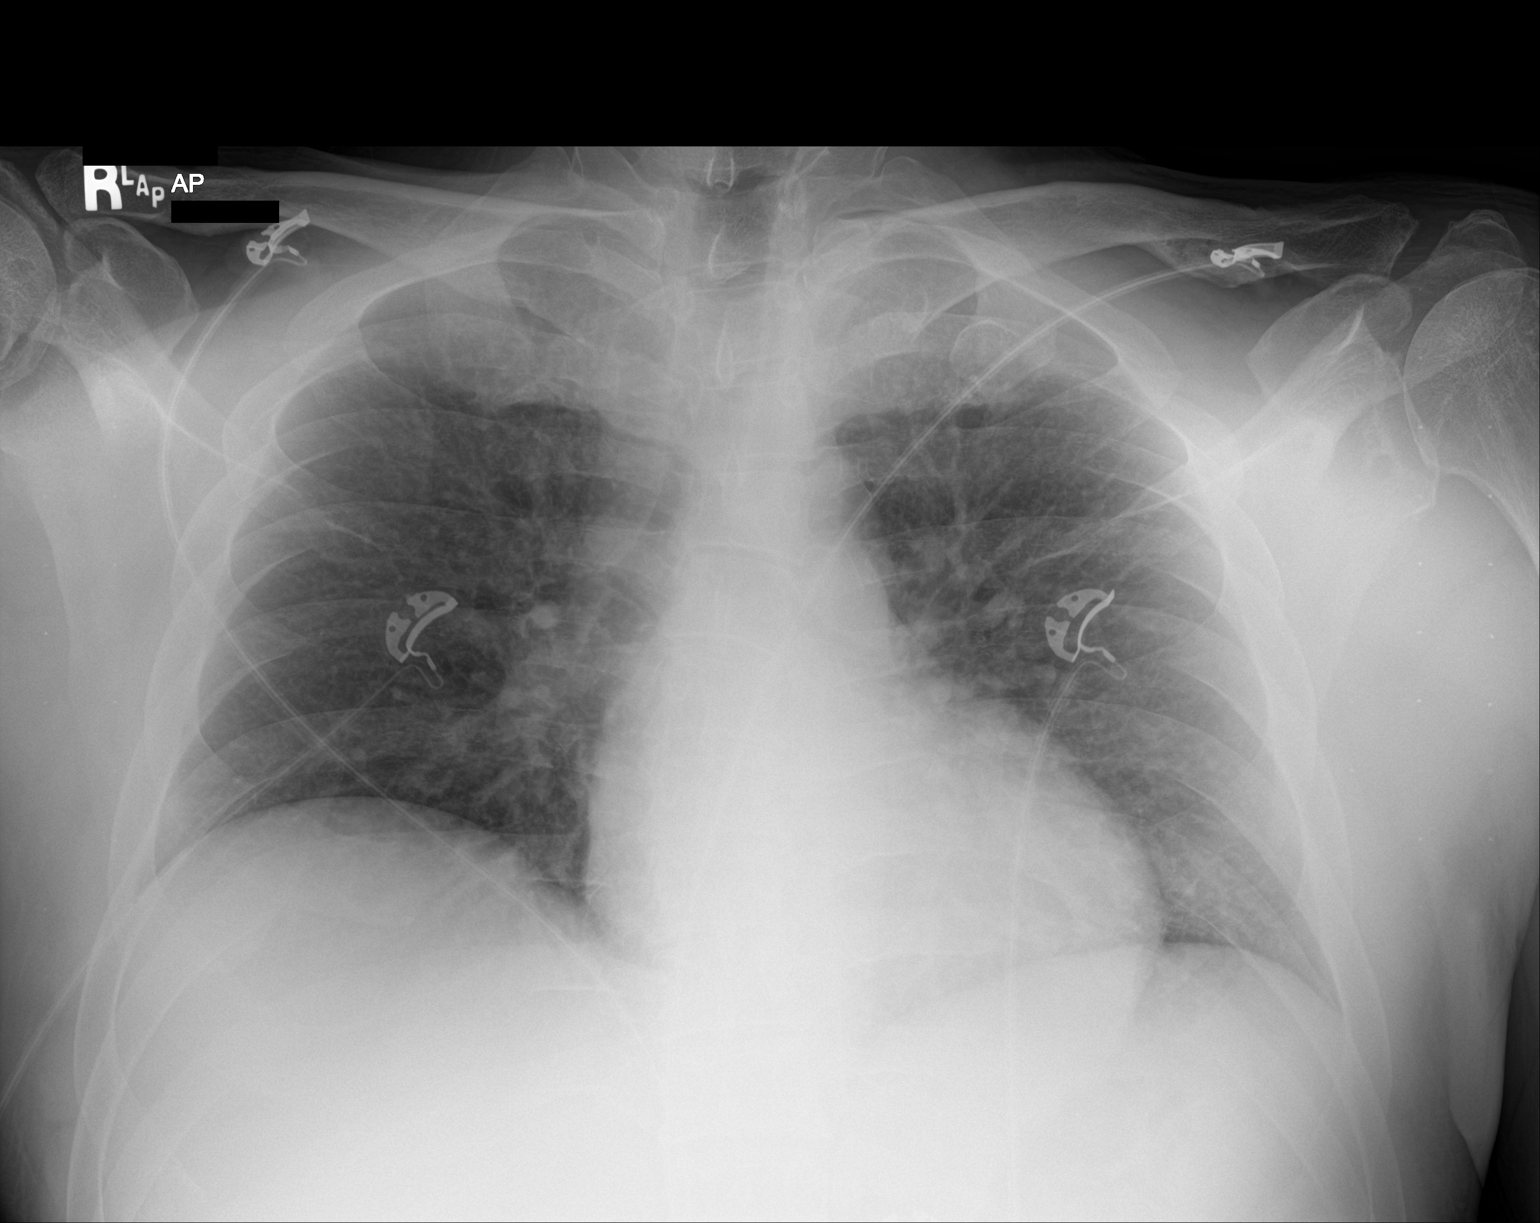

[1 of 1 positions shown; findings below may reference images not displayed]

FINDINGS: Poor inspiratory effort. Heart size is normal. Allowing for the poor
inspiration, the lungs are probably clear. No edema or effusions. No
acute bone finding.
IMPRESSION: Poor inspiration.  No active disease suspected allowing for that.

## 2019-12-23 IMAGING — DX DG CHEST 1V PORT
1 series · 1 of 1 positions shown · non-contrast
Comparison: 03/20/2017

CLINICAL DATA: Acute respiratory failure.

EXAM:
PORTABLE CHEST 1 VIEW

[chest ap]
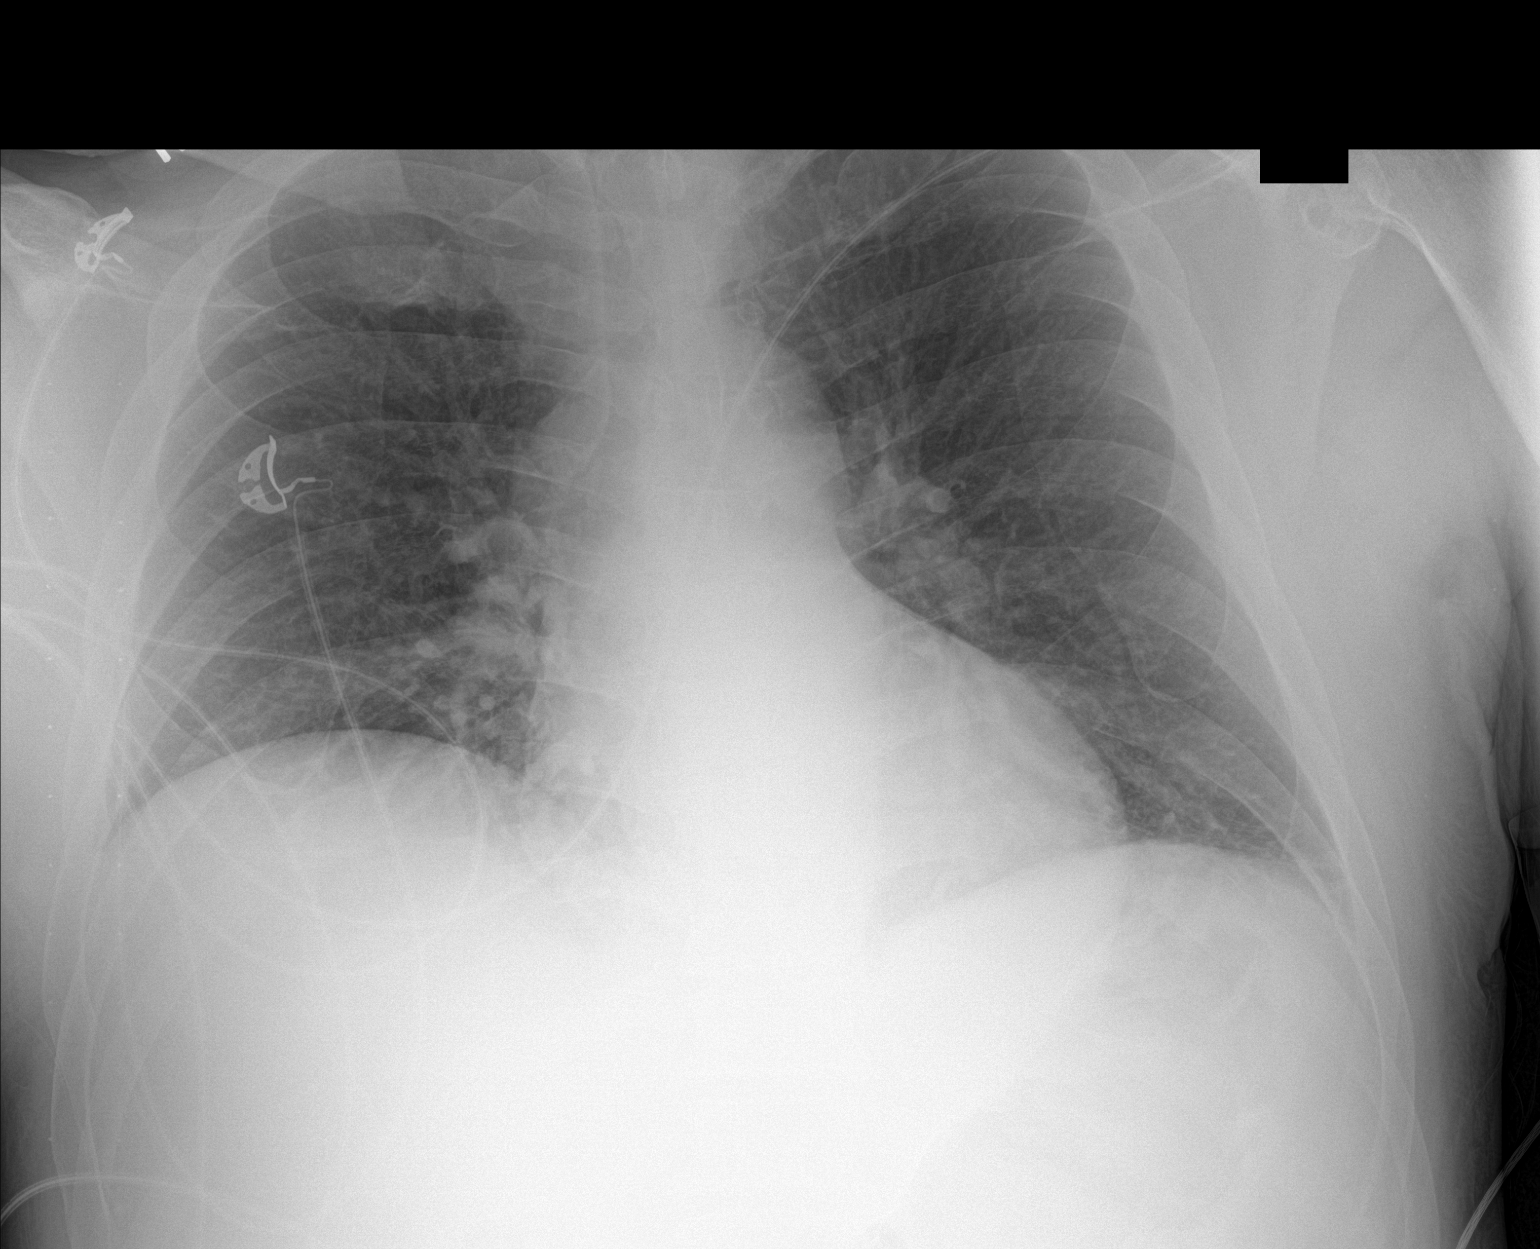

[1 of 1 positions shown; findings below may reference images not displayed]

FINDINGS: Heart size is normal. Mediastinal shadows are normal. Patient has
taken a poor inspiration. Allowing for that, the lungs are probably
clear. There could be mild basilar atelectasis. No dense
consolidation or lobar collapse. No effusions.
IMPRESSION: Poor inspiration.  Allowing for that, no active disease is suspected
